# Patient Record
Sex: Male | Born: 1950 | Race: White | Hispanic: No | Marital: Married | Smoking: Former smoker
Health system: Southern US, Community
[De-identification: ages and names within clinical notes are randomized; demographics above are authoritative.]

## PROBLEM LIST (undated history)

## (undated) DIAGNOSIS — E785 Hyperlipidemia, unspecified: Secondary | ICD-10-CM

## (undated) DIAGNOSIS — IMO0002 Reserved for concepts with insufficient information to code with codable children: Secondary | ICD-10-CM

## (undated) DIAGNOSIS — G4710 Hypersomnia, unspecified: Secondary | ICD-10-CM

## (undated) DIAGNOSIS — E669 Obesity, unspecified: Secondary | ICD-10-CM

## (undated) DIAGNOSIS — I10 Essential (primary) hypertension: Secondary | ICD-10-CM

## (undated) DIAGNOSIS — E119 Type 2 diabetes mellitus without complications: Secondary | ICD-10-CM

## (undated) DIAGNOSIS — G629 Polyneuropathy, unspecified: Secondary | ICD-10-CM

## (undated) DIAGNOSIS — M79671 Pain in right foot: Secondary | ICD-10-CM

## (undated) DIAGNOSIS — C61 Malignant neoplasm of prostate: Secondary | ICD-10-CM

## (undated) DIAGNOSIS — G4730 Sleep apnea, unspecified: Secondary | ICD-10-CM

## (undated) DIAGNOSIS — R0683 Snoring: Secondary | ICD-10-CM

## (undated) DIAGNOSIS — R55 Syncope and collapse: Secondary | ICD-10-CM

## (undated) HISTORY — DX: Polyneuropathy, unspecified: G62.9

## (undated) HISTORY — DX: Reserved for concepts with insufficient information to code with codable children: IMO0002

## (undated) HISTORY — DX: Pain in right foot: M79.671

## (undated) HISTORY — PX: PROSTATE BIOPSY: SHX241

## (undated) HISTORY — DX: Hyperlipidemia, unspecified: E78.5

## (undated) HISTORY — DX: Essential (primary) hypertension: I10

## (undated) HISTORY — DX: Syncope and collapse: R55

## (undated) HISTORY — DX: Sleep apnea, unspecified: G47.30

## (undated) HISTORY — DX: Obesity, unspecified: E66.9

## (undated) HISTORY — DX: Snoring: R06.83

## (undated) HISTORY — DX: Type 2 diabetes mellitus without complications: E11.9

## (undated) HISTORY — DX: Hypersomnia, unspecified: G47.10

---

## 1970-01-13 HISTORY — PX: APPENDECTOMY: SHX54

## 2004-01-03 ENCOUNTER — Ambulatory Visit (HOSPITAL_COMMUNITY): Admission: RE | Admit: 2004-01-03 | Discharge: 2004-01-03 | Payer: Self-pay | Admitting: Urology

## 2004-01-03 ENCOUNTER — Encounter (INDEPENDENT_AMBULATORY_CARE_PROVIDER_SITE_OTHER): Payer: Self-pay | Admitting: *Deleted

## 2004-01-03 ENCOUNTER — Ambulatory Visit (HOSPITAL_BASED_OUTPATIENT_CLINIC_OR_DEPARTMENT_OTHER): Admission: RE | Admit: 2004-01-03 | Discharge: 2004-01-03 | Payer: Self-pay | Admitting: Urology

## 2006-06-17 ENCOUNTER — Ambulatory Visit: Payer: Self-pay | Admitting: Gastroenterology

## 2006-07-23 ENCOUNTER — Ambulatory Visit: Payer: Self-pay | Admitting: Gastroenterology

## 2006-07-23 ENCOUNTER — Encounter: Payer: Self-pay | Admitting: Gastroenterology

## 2007-06-05 DIAGNOSIS — D126 Benign neoplasm of colon, unspecified: Secondary | ICD-10-CM

## 2007-06-05 DIAGNOSIS — E785 Hyperlipidemia, unspecified: Secondary | ICD-10-CM | POA: Insufficient documentation

## 2007-06-05 DIAGNOSIS — I10 Essential (primary) hypertension: Secondary | ICD-10-CM | POA: Insufficient documentation

## 2007-06-05 DIAGNOSIS — Z86010 Personal history of colonic polyps: Secondary | ICD-10-CM | POA: Insufficient documentation

## 2007-06-05 DIAGNOSIS — I1A0 Resistant hypertension: Secondary | ICD-10-CM | POA: Insufficient documentation

## 2007-06-05 DIAGNOSIS — Z87442 Personal history of urinary calculi: Secondary | ICD-10-CM | POA: Insufficient documentation

## 2007-06-05 DIAGNOSIS — E109 Type 1 diabetes mellitus without complications: Secondary | ICD-10-CM | POA: Insufficient documentation

## 2007-06-05 DIAGNOSIS — E1129 Type 2 diabetes mellitus with other diabetic kidney complication: Secondary | ICD-10-CM | POA: Insufficient documentation

## 2007-06-05 DIAGNOSIS — Z8719 Personal history of other diseases of the digestive system: Secondary | ICD-10-CM | POA: Insufficient documentation

## 2010-05-28 NOTE — Assessment & Plan Note (Signed)
[facility]                         GASTROENTEROLOGY OFFICE NOTE   Chase, [name]                         MRN:          [id number]  DATE:06/17/2006                            DOB:          02/08/1950    CHIEF COMPLAINT:  A 60 year old white male, self-referred for a  colorectal cancer screening, with a history of insulin-dependent  diabetes mellitus.   HISTORY OF PRESENT ILLNESS:  Chase [name] is a very nice 60 year old white  male who is followed by Dr. Antony Madura.  Chase [name] would like a  screening colonoscopy.  He has had diabetes for approximately 10 years,  and he is insulin-dependent.  He has no colorectal complaints and  specifically denies any abdominal pain, rectal pain, change in bowel  habits, change in stool caliber, constipation, diarrhea, melena or  hematochezia.  There is no family history of colon cancer, colon polyps  or inflammatory bowel disease.   PAST MEDICAL HISTORY:  1. Hypertension.  2. Insulin-dependent diabetes mellitus.  3. Hyperlipidemia.  4. Kidney stones.  5. Status post appendectomy.   CURRENT MEDICATIONS:  Listed on the chart, updated and reviewed.   ALLERGIES:  CODEINE, LEADING TO NAUSEA AND VOMITING.   SOCIAL HISTORY/REVIEW OF SYSTEMS:  Per the handwritten form.   PHYSICAL EXAMINATION:  GENERAL:  An overweight white male, in no acute  distress.  VITAL SIGNS:  Height 5 feet 8 inches, weight 236.2 pounds, blood  pressure 120/78, pulse 88 and regular.  HEENT:  Normocephalic and atraumatic.  Sclerae anicteric.  Oropharynx  clear.  CHEST:  Clear to auscultation bilaterally.  CARDIAC:  A regular rate and rhythm without murmurs appreciated.  ABDOMEN:  Soft, nontender, non-distended.  Normoactive bowel sounds.  No  palpable organomegaly, masses or hernia.  RECTAL:  Deferred until time of colonoscopy.  EXTREMITIES:  Without clubbing, cyanosis or edema.  NEUROLOGIC:  Alert and oriented x3.  Grossly  nonfocal.   ASSESSMENT/PLAN:  1. Average risk for colorectal cancer. Insulin-dependent diabetes      mellitus. The risks, benefits      and alternatives to colonoscopy, possible biopsy and possible      polypectomy discussed with      the patient.  He consents to proceed.  This will be scheduled      electively.  His insulin will be adjusted as per our standard      protocol and a morning procedure will be scheduled.     Venita Lick. Russella Dar, MD, [facility]  Electronically Signed    MTS/MedQ  DD: 06/17/2006  DT: 06/17/2006  Job #: [id number]

## 2010-05-31 NOTE — Op Note (Signed)
NAMECAYDEN, Chase [name]                ACCOUNT NO.:  [id number]   MEDICAL RECORD NO.:  [id number]          PATIENT TYPE:  AMB   LOCATION:  [facility]                         FACILITY:  [facility]   PHYSICIAN:  Bertram Millard. Dahlstedt, M.D.DATE OF BIRTH:  Dec 13, 1950   DATE OF PROCEDURE:  01/03/2004  DATE OF DISCHARGE:                                 OPERATIVE REPORT   PREOPERATIVE DIAGNOSES:  Phimosis with recurrent balanitis.   POSTOPERATIVE DIAGNOSES:  Phimosis with recurrent balanitis.   PRINCIPAL PROCEDURE:  Circumcision.   SURGEON:  Bertram Millard. Dahlstedt, M.D.   ANESTHESIA:  General with LMA.   COMPLICATIONS:  None.   BRIEF HISTORY:  A 60 year old diabetic male with recurrent balanitis and  with significant phimosis.  He has painful foreskin with retraction of his  foreskin or with intercourse.  He presented recently to the office for  consultation.   I recommended that the patient, with his diabetes and significant phimosis,  undergo circumcision. The risks and complications were discussed with the  patient.  He understands these and agrees to proceed.   DESCRIPTION OF PROCEDURE:  The patient was administered preoperative IV  antibiotics and taken to the operating room where he was eventually  anesthetized with LMA.  He was placed in a recumbent position. Genitalia and  perineum were prepped and draped.  Two circumcising incisions were made in  the foreskin proximally and distally. The foreskin was excised. Small  bleeders underneath were electrocoagulated. A U stitch was placed in the  frenulum to bring the proximal to the distal penile skin.  3-0 chromic was  used for this. Quadrant sutures of simple interrupted 3-0 chromic were then  placed.  In between these, simple running sutures of 3-0 chromic were  placed. Hemostasis was excellent. The usual dressing was placed.   The patient tolerated the procedure well.  He was transported to the PACU in  stable condition.   It should be  noted that the patient was administered 20 mL of 0.5% plain  Marcaine for dorsal penile block.     Step   SMD/MEDQ  D:  01/03/2004  T:  01/03/2004  Job:  [id number]   cc:   Antony Madura, M.D.  [address]  [place]  [place] [address]  Fax: [phone]

## 2011-05-01 ENCOUNTER — Encounter: Payer: Self-pay | Admitting: Gastroenterology

## 2011-06-02 ENCOUNTER — Encounter: Payer: Self-pay | Admitting: Gastroenterology

## 2011-06-23 ENCOUNTER — Ambulatory Visit (AMBULATORY_SURGERY_CENTER): Payer: Medicare Other | Admitting: *Deleted

## 2011-06-23 VITALS — Ht 68.0 in | Wt 250.6 lb

## 2011-06-23 DIAGNOSIS — Z1211 Encounter for screening for malignant neoplasm of colon: Secondary | ICD-10-CM

## 2011-06-23 MED ORDER — MOVIPREP 100 G PO SOLR: ORAL | Status: DC

## 2011-06-23 NOTE — Progress Notes (Signed)
No allergies to eggs and soybeans 

## 2011-07-07 ENCOUNTER — Ambulatory Visit (AMBULATORY_SURGERY_CENTER): Payer: Medicare Other | Admitting: Gastroenterology

## 2011-07-07 ENCOUNTER — Encounter: Payer: Self-pay | Admitting: Gastroenterology

## 2011-07-07 VITALS — BP 108/65 | HR 87 | Temp 96.9°F | Resp 14 | Ht 68.0 in | Wt 250.0 lb

## 2011-07-07 DIAGNOSIS — D126 Benign neoplasm of colon, unspecified: Secondary | ICD-10-CM

## 2011-07-07 DIAGNOSIS — Z1211 Encounter for screening for malignant neoplasm of colon: Secondary | ICD-10-CM

## 2011-07-07 DIAGNOSIS — Z86010 Personal history of colonic polyps: Secondary | ICD-10-CM

## 2011-07-07 MED ORDER — SODIUM CHLORIDE 0.9 % IV SOLN: 500.0000 mL | INTRAVENOUS | Status: DC

## 2011-07-07 NOTE — Op Note (Signed)
[facility] [address] [place], [place]  [address]  COLONOSCOPY PROCEDURE REPORT PATIENT:  Chase, [name]  MR#:  [id number] BIRTHDATE:  09-09-1950, 60 yrs. old  GENDER:  male ENDOSCOPIST:  Judie Petit T. Russella Dar, MD, [facility]  PROCEDURE DATE:  07/07/2011 PROCEDURE:  Colonoscopy with biopsy and snare polypectomy ASA CLASS:  Class II INDICATIONS:  1) surveillance and high-risk screening  2) history of pre-cancerous (adenomatous) colon polyps: 07/2006 MEDICATIONS:   MAC sedation, administered by CRNA, propofol (Diprivan) 250 mg IV DESCRIPTION OF PROCEDURE:   After the risks benefits and alternatives of the procedure were thoroughly explained, informed consent was obtained.  Digital rectal exam was performed and revealed no abnormalities.   The LB [id number]-[id number] endoscope was introduced through the anus and advanced to the cecum, which was identified by both the appendix and ileocecal valve, without limitations.  The quality of the prep was good, using MoviPrep. The instrument was then slowly withdrawn as the colon was fully examined. <<PROCEDUREIMAGES>> FINDINGS:  A sessile polyp was found in the ascending colon. It was 4 mm in size. Polyp was snared without cautery. Retrieval was unsuccessful.   Two polyps were found in the mid transverse colon. They were 5 - 6 mm in size. Polyps were snared without cautery. Retrieval was successful. Two polyps were found in the distal transverse colon. They were 3 mm in size. The polyps were removed using cold biopsy forceps.  Otherwise normal colonoscopy without other polyps, masses, vascular ectasias, or inflammatory changes. Retroflexed views in the rectum revealed internal hemorrhoids, small. The time to cecum =  2 minutes. The scope was then withdrawn (time = 14.25 min) from the patient and the procedure completed.  COMPLICATIONS:  None  ENDOSCOPIC IMPRESSION: 1) 4 mm sessile polyp in the ascending colon 2) 5 - 6 mm Two polyps in  the mid transverse colon 3) 3 mm Two polyps in the distal transverse colon 4) Internal hemorrhoids  RECOMMENDATIONS: 1) Await pathology results 2) Repeat Colonoscopy in 5 years.  Venita Lick. Russella Dar, MD, [facility]  CC:  Burton Apley, MD  n. Rosalie DoctorVenita Lick. Ilyaas Musto at 07/07/2011 10:37 AM  [name], [phone]

## 2011-07-07 NOTE — Progress Notes (Signed)
Patient did not experience any of the following events: a burn prior to discharge; a fall within the facility; wrong site/side/patient/procedure/implant event; or a hospital transfer or hospital admission upon discharge from the facility. (G8907) Patient did not have preoperative order for IV antibiotic SSI prophylaxis. (G8918)  

## 2011-07-07 NOTE — Patient Instructions (Addendum)

## 2011-07-08 ENCOUNTER — Telehealth: Payer: Self-pay | Admitting: *Deleted

## 2011-07-08 NOTE — Telephone Encounter (Signed)
  Follow up Call-  Call back number 07/07/2011  Post procedure Call Back phone  # [phone]  Permission to leave phone message Yes     No answer and no answering machine picked up to leave a message

## 2011-07-10 ENCOUNTER — Encounter: Payer: Self-pay | Admitting: Gastroenterology

## 2012-01-28 ENCOUNTER — Ambulatory Visit
Admission: RE | Admit: 2012-01-28 | Discharge: 2012-01-28 | Disposition: A | Discharge status: Home / Self Care | Payer: Medicare Other | Source: Ambulatory Visit | Attending: Internal Medicine | Admitting: Internal Medicine

## 2012-01-28 ENCOUNTER — Other Ambulatory Visit: Payer: Self-pay | Admitting: Internal Medicine

## 2012-01-28 DIAGNOSIS — Z139 Encounter for screening, unspecified: Secondary | ICD-10-CM

## 2012-01-28 DIAGNOSIS — R42 Dizziness and giddiness: Secondary | ICD-10-CM

## 2012-01-30 ENCOUNTER — Ambulatory Visit
Admission: RE | Admit: 2012-01-30 | Discharge: 2012-01-30 | Disposition: A | Discharge status: Home / Self Care | Payer: Medicare Other | Source: Ambulatory Visit | Attending: Internal Medicine | Admitting: Internal Medicine

## 2012-01-30 DIAGNOSIS — R42 Dizziness and giddiness: Secondary | ICD-10-CM

## 2012-01-30 MED ORDER — GADOBENATE DIMEGLUMINE 529 MG/ML IV SOLN
20.0000 mL | Freq: Once | INTRAVENOUS | Status: AC | PRN
  Administered 2012-01-30: 20 mL via INTRAVENOUS

## 2012-06-03 ENCOUNTER — Encounter: Payer: Self-pay | Admitting: Neurology

## 2012-06-03 ENCOUNTER — Ambulatory Visit (INDEPENDENT_AMBULATORY_CARE_PROVIDER_SITE_OTHER): Payer: Medicare Other | Admitting: Neurology

## 2012-06-03 DIAGNOSIS — G4710 Hypersomnia, unspecified: Secondary | ICD-10-CM | POA: Insufficient documentation

## 2012-06-03 DIAGNOSIS — R0609 Other forms of dyspnea: Secondary | ICD-10-CM

## 2012-06-03 DIAGNOSIS — R0989 Other specified symptoms and signs involving the circulatory and respiratory systems: Secondary | ICD-10-CM

## 2012-06-03 DIAGNOSIS — E6609 Other obesity due to excess calories: Secondary | ICD-10-CM | POA: Insufficient documentation

## 2012-06-03 DIAGNOSIS — IMO0002 Reserved for concepts with insufficient information to code with codable children: Secondary | ICD-10-CM

## 2012-06-03 DIAGNOSIS — R0683 Snoring: Secondary | ICD-10-CM

## 2012-06-03 DIAGNOSIS — F518 Other sleep disorders not due to a substance or known physiological condition: Secondary | ICD-10-CM

## 2012-06-03 MED ORDER — ARMODAFINIL 150 MG PO TABS: 150.0000 mg | ORAL_TABLET | Freq: Every day | ORAL | Status: DC

## 2012-06-03 NOTE — Patient Instructions (Addendum)
Narcolepsy Narcolepsy is a disabling neurological disorder of sleep regulation. It affects the control of sleep. It also affects the control of wakefulness. It is an interruption of the dreaming state of sleep. This state is known as REM or rapid eye movement sleep.  SYMPTOMS  The development, number, and severity of symptoms vary widely among people with the disorder. Symptoms generally begin between the ages of 3815 and 2230. The four classic symptoms of the disorder are:   Excessive daytime sleepiness.  Cataplexy. This is sudden, brief episodes of muscle weakness or paralysis. It is caused by strong emotions. Common strong emotions are laughter, anger, surprise, or anticipation.  Sleep paralysis. This is paralysis upon falling asleep or waking up.  Hallucinations. These are vivid dream-like images that occur at when you first fall asleep. Other symptoms include:   Unrelenting excessive sleepiness. This is usually the first and most obvious symptom.  Sleep attacks. Patients have strong sleep attacks throughout the day. These attacks can last for 30 seconds to more than 30 minutes. These happen no matter how much or how well the person slept the night before. These attacks end up making the person sleep at work and social events. The person can fall asleep while eating, talking, and driving. They also fall asleep at other out of place times.  Disturbed nighttime sleep.  Tossing and turning in bed.  Leg jerks.  Nightmares.  Waking up often. DIAGNOSIS  It's possible that genetics play a role in this disorder. Narcolepsy is not a rare disorder. It is often misdiagnosed. It is often diagnosed years after symptoms first appear. Early diagnosis and treatment are important. This help the physical and mental well-being of the patient. TREATMENT  There is no cure for narcolepsy. The symptoms can be controlled with behavioral and medical therapy. The excessive daytime sleepiness may be treated with  stimulant drugs. It may also be treated with the drug modafinil (Provigil). Cataplexy and other REM-sleep symptoms may be treated with antidepressant medications. Medications will reduce the symptoms. Medications will not ease symptoms entirely. Many available medications have side effects. Basic lifestyle changes may also reduce the symptoms. These changes include having regular sleep schedules and scheduled daytime naps. Other lifestyle changes include avoiding "over-stimulating" situations. Document Released: 12/20/2001 Document Revised: 03/24/2011 Document Reviewed: 12/30/2004 [facility] Patient Information 2014 [place], [place]. Sleep Apnea  Sleep apnea is a sleep disorder characterized by abnormal pauses in breathing while you sleep. When your breathing pauses, the level of oxygen in your blood decreases. This causes you to move out of deep sleep and into light sleep. As a result, your quality of sleep is poor, and the system that carries your blood throughout your body (cardiovascular system) experiences stress. If sleep apnea remains untreated, the following conditions can develop:  High blood pressure (hypertension).  Coronary artery disease.  Inability to achieve or maintain an erection (impotence).  Impairment of your thought process (cognitive dysfunction). There are three types of sleep apnea: 1. Obstructive sleep apnea Pauses in breathing during sleep because of a blocked airway. 2. Central sleep apnea Pauses in breathing during sleep because the area of the brain that controls your breathing does not send the correct signals to the muscles that control breathing. 3. Mixed sleep apnea A combination of both obstructive and central sleep apnea. RISK FACTORS The following risk factors can increase your risk of developing sleep apnea:  Being overweight.  Smoking.  Having narrow passages in your nose and throat.  Being of older age.  Being male.  Alcohol use.  Sedative and  tranquilizer use.  Ethnicity. Among individuals younger than 35 years, African Americans are at increased risk of sleep apnea. SYMPTOMS   Difficulty staying asleep.  Daytime sleepiness and fatigue.  Loss of energy.  Irritability.  Loud, heavy snoring.  Morning headaches.  Trouble concentrating.  Forgetfulness.  Decreased interest in sex. DIAGNOSIS  In order to diagnose sleep apnea, your caregiver will perform a physical examination. Your caregiver may suggest that you take a home sleep test. Your caregiver may also recommend that you spend the night in a sleep lab. In the sleep lab, several monitors record information about your heart, lungs, and brain while you sleep. Your leg and arm movements and blood oxygen level are also recorded. TREATMENT The following actions may help to resolve mild sleep apnea:  Sleeping on your side.   Using a decongestant if you have nasal congestion.   Avoiding the use of depressants, including alcohol, sedatives, and narcotics.   Losing weight and modifying your diet if you are overweight. There also are devices and treatments to help open your airway:  Oral appliances. These are custom-made mouthpieces that shift your lower jaw forward and slightly open your bite. This opens your airway.  Devices that create positive airway pressure. This positive pressure "splints" your airway open to help you breathe better during sleep. The following devices create positive airway pressure:  Continuous positive airway pressure (CPAP) device. The CPAP device creates a continuous level of air pressure with an air pump. The air is delivered to your airway through a mask while you sleep. This continuous pressure keeps your airway open.  Nasal expiratory positive airway pressure (EPAP) device. The EPAP device creates positive air pressure as you exhale. The device consists of single-use valves, which are inserted into each nostril and held in place by  adhesive. The valves create very little resistance when you inhale but create much more resistance when you exhale. That increased resistance creates the positive airway pressure. This positive pressure while you exhale keeps your airway open, making it easier to breath when you inhale again.  Bilevel positive airway pressure (BPAP) device. The BPAP device is used mainly in patients with central sleep apnea. This device is similar to the CPAP device because it also uses an air pump to deliver continuous air pressure through a mask. However, with the BPAP machine, the pressure is set at two different levels. The pressure when you exhale is lower than the pressure when you inhale.  Surgery. Typically, surgery is only done if you cannot comply with less invasive treatments or if the less invasive treatments do not improve your condition. Surgery involves removing excess tissue in your airway to create a wider passage way. Document Released: 12/20/2001 Document Revised: 07/01/2011 Document Reviewed: 05/08/2011 [facility] Patient Information 2014 [place], [place]. Driving and Equipment Restrictions Some medical problems make it dangerous to drive, ride a bike, or use machines. Some of these problems are:  A hard blow to the head (concussion).  Passing out (fainting).  Twitching and shaking (seizures).  Low blood sugar.  Taking medicine to help you relax (sedatives).  Taking pain medicines.  Wearing an eye patch.  Wearing splints. This can make it hard to use parts of your body that you need to drive safely. HOME CARE   Do not drive until your doctor says it is okay.  Do not use machines until your doctor says it is okay. You may need a form signed  by your doctor (medical release) before you can drive again. You may also need this form before you do other tasks where you need to be fully alert. MAKE SURE YOU:  Understand these instructions.  Will watch your condition.  Will get help right  away if you are not doing well or get worse. Document Released: 02/07/2004 Document Revised: 03/24/2011 Document Reviewed: 05/09/2009 [facility] Patient Information 2014 [place], [place].

## 2012-06-03 NOTE — Progress Notes (Signed)
[facility]  Provider:  Dr Deadra Diggins Referring Provider: Burton Apley, MD Primary Care Physician:  Lorenda Peck, MD  Chief Complaint  Patient presents with  . New Evaluation    mini seizures, Roberts,rm 11    HPI:  Chase [name] is a 62 y.o. male here as a referral from Dr. Su Hilt for valuation of I called mini seizures. The patient's past medical history was endorsed is positive for hypertension, diabetes mellitus him recently diagnosed  With  gouty arthritis, and the referral  Mentioned falls. [name] reports that he has sudden else where he seems to longer be mentally aware of his surroundings, he has also found himself passing old 4 seconds while driving or while working on the computer, once his wife yelled at him when he drove on the left side of the road and even tried to park on the left side. He mentioned that sometimes when he feels that his eyes and sits peacefully he will immediately drift off into a dream and he describes that he sometimes acts out part of the dreams. For example, he may dream about being in a strawberry field and his hands are moving in picking motions while he is in this state.  The patient estimates that about a year to 18 months ago the spells was begun and at times he has two a week. He stopped driving with the grandchildren and is afraid of him having an accident, and his wife is concerned as well.  Dr. Su Hilt ordered an MRI of the brain for this patient with and without contrast agent this was performed on 01/30/2012 and was found to be normal. 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 His laboratory results from 04/09/2012 show high uric acid level at 8.1 mg, his glucose level was 167 mg ,with the HBA1c at  8.3.  There is a repeat lab test from 05/18/2012 that showed an elevated white blood cell count of 10.8 K. normal TSH elevated cholesterol at 226 mg and repeat hemoglobin A1c at 7.7. He was also diagnosed with a  lower than average testosterone and has been prescribed supplements.  He reports the testosterone  made him feel strong and motivated.  The patient also reports excessive daytime sleepiness score of 16 points. He does not have regular bedtimes, but he reports that when he goes to the bathroom he often has trouble to initiate sleep. If he goes to his recliner and watches TV he seemed to fall asleep rather easily and finds himself waking up with a remote on the floor or in his hand.  His sleep habits are as follows - to the bedroom 11:30 PM to midnight, rolling asleep takes them on average an hour. It seems to be a paradox reaction to going to bed as he then has trouble initiating sleep. He often leaves the bedroom therefore goes to his recliner, where he falls asleep promptly. He wakes up between 3:30 and 4 AM spontaneously he does not have to go to the bathroom necessarily, after he wakes up he may watch TV or go to the computer screen and then goes to bed again. He estimates his overall total sleep time at night around 4 hours. His wife has noted that he snores loudly his wife has witnessed apnea. He has chronic allergic rhinitis.    .     Review of Systems: Out of a complete 14 system review, the patient complains of only the following symptoms, and all other reviewed systems  are negative. Insomnia, rhinitis, snoring, apnea, loss of awareness in little blips. Obese , no nocturia.   History   Social History  . Marital Status: Married    Spouse Name: N/A    Number of Children: N/A  . Years of Education: N/A   Occupational History  . Not on file.   Social History Main Topics  . Smoking status: Former Smoker    Quit date: 06/23/1986  . Smokeless tobacco: Never Used  . Alcohol Use: No  . Drug Use: No  . Sexually Active: Not on file   Other Topics Concern  . Not on file   Social History Narrative  . No narrative on file    Family History  Problem Relation Age of Onset  .  Colon cancer Neg Hx   . Stomach cancer Neg Hx   . Dementia Mother   . Neuropathy Brother     Past Medical History  Diagnosis Date  . Diabetes mellitus   . Hypertension   . Hyperlipidemia   . Neuropathy     hands and feet  . Syncope and collapse     Past Surgical History  Procedure Laterality Date  . Appendectomy  1972    Current Outpatient Prescriptions  Medication Sig Dispense Refill  . allopurinol (ZYLOPRIM) 100 MG tablet Take 100 mg by mouth daily. Once daily      . amLODipine (NORVASC) 10 MG tablet Take 10 mg by mouth daily.       Marland Kitchen aspirin 81 MG tablet Take 81 mg by mouth daily.      . chlorthalidone (HYGROTON) 25 MG tablet Take 25 mg by mouth daily.       Tery Sanfilippo Calcium (STOOL SOFTENER PO) Take by mouth. Takes 1 tablet every other day      . doxazosin (CARDURA) 2 MG tablet Take 2 mg by mouth at bedtime.      . Insulin Glargine (LANTUS [name]) Inject into the skin. 6pm daily      . lisinopril (PRINIVIL,ZESTRIL) 40 MG tablet Take 40 mg by mouth daily.       Marland Kitchen RELION INSULIN SYRINGE 1ML/31G 31G X 5/16" 1 ML MISC       . testosterone cypionate (DEPOTESTOTERONE CYPIONATE) 200 MG/ML injection Inject into the muscle every 30 (thirty) days.       No current facility-administered medications for this visit.    Allergies as of 06/03/2012  . (No Known Allergies)    Vitals: BP 124/72  Pulse 100  Temp(Src) 97.6 F (36.4 C) (Oral)  Ht 5\' 9"  (1.753 m)  Wt 264 lb (119.75 kg)  BMI 38.97 kg/m2 Last Weight:  Wt Readings from Last 1 Encounters:  06/03/12 264 lb (119.75 kg)   Last Height:   Ht Readings from Last 1 Encounters:  06/03/12 5\' 9"  (1.753 m)   Vision Screening:  Physical exam:  General: The patient is awake, alert and appears not in acute distress. The patient is well groomed. Head: Normocephalic, atraumatic. Neck is supple. Mallampati 2 with elongated uvula,  septal deviation noticed - he has a nasal voice.  , neck circumference: 18 inches  Cardiovascular:   Regular rate and rhythm , without  murmurs or carotid bruit, and without distended neck veins. Respiratory: Lungs are clear to auscultation. Skin:  Without evidence of edema, or rash Trunk: BMI is highly  elevated ,obese ,  patient  has normal posture.  Neurologic exam : The patient is awake and alert, oriented to place and time.  Memory subjective  described as intact. There is a normal attention span & concentration ability. Speech is fluent without dysarthria, dysphonia or aphasia nasal Speech . Mood and affect are appropriate.  Cranial nerves: Pupils are equal and briskly reactive to light. Funduscopic exam without evidence of pallor or edema. Extraocular movements  in vertical and horizontal planes intact and without nystagmus. Visual fields by finger perimetry are intact. Hearing to finger rub intact.  Facial sensation intact to fine touch. Facial motor strength is symmetric and tongue and uvula move midline.  Motor exam: Normal tone and normal muscle bulk and symmetric normal strength in all extremities.  Sensory:  Fine touch, pinprick and vibration were tested in all extremities. Proprioception is tested in the upper extremities only. This was  normal.  Coordination: Rapid alternating movements in the fingers/hands is tested and normal. Finger-to-nose maneuver tested and normal without evidence of ataxia, dysmetria or tremor.  Gait and station: Patient walks without assistive device and is able and assisted stool climb up to the exam table. Strength within normal limits. Stance is stable and normal. Tandem gait is unfragmented. Romberg testing is normal.  Deep tendon reflexes: in the  upper and lower extremities are symmetric and intact. Babinski maneuver response is  downgoing.   Assessment:  After physical and neurologic examination, review of laboratory studies, imaging, neurophysiology testing and pre-existing records, assessment will be reviewed on the problem list.  Plan:   Treatment plan and additional workup ( see Problem List).  This patient's history points to severe daytime hypersomnia, inability to fall asleep in supine position, orthopnea. History of rhinitis and nasal septum deviation. Obesity, diabetes and gouty arthritis. I suspect strongly that sleep apnea as a cause for the patient's brief periods of loss of consciousness or awareness, these are not associated with any convulsion, and he reports going into a dream status very quickly when taking a nap. He has an irresistible urge to fall asleep he is a former smoker but hasn't smoked in 20 years. His risk factors for obstructive sleep apnea are multiple. There are also witnessed snoring and apnea spells as described by his wife. The Epworth sleepiness scale was endorsed at 16 points. No evidence of cataplectic attacks. He describes that when he gets startled he can move and react normal Plan I will order a split night study for this patient, but given his complaint of insomnia when in bed, I would allow him for a sleep aid. In addition I will not perform an EEG. The MRI was normal and not indicative of TIAs .

## 2012-06-16 ENCOUNTER — Ambulatory Visit (INDEPENDENT_AMBULATORY_CARE_PROVIDER_SITE_OTHER): Payer: Medicare Other | Admitting: Neurology

## 2012-06-16 DIAGNOSIS — G4733 Obstructive sleep apnea (adult) (pediatric): Secondary | ICD-10-CM

## 2012-06-16 DIAGNOSIS — R0683 Snoring: Secondary | ICD-10-CM

## 2012-06-16 DIAGNOSIS — G4710 Hypersomnia, unspecified: Secondary | ICD-10-CM

## 2012-06-16 DIAGNOSIS — F518 Other sleep disorders not due to a substance or known physiological condition: Secondary | ICD-10-CM

## 2012-06-28 ENCOUNTER — Other Ambulatory Visit: Payer: Self-pay | Admitting: Neurology

## 2012-06-28 DIAGNOSIS — G4733 Obstructive sleep apnea (adult) (pediatric): Secondary | ICD-10-CM

## 2012-07-12 ENCOUNTER — Encounter: Payer: Self-pay | Admitting: *Deleted

## 2012-07-12 DIAGNOSIS — G4733 Obstructive sleep apnea (adult) (pediatric): Secondary | ICD-10-CM | POA: Insufficient documentation

## 2012-07-12 NOTE — Progress Notes (Signed)
See media tab for full report  

## 2012-08-24 ENCOUNTER — Encounter: Payer: Self-pay | Admitting: Neurology

## 2012-09-24 ENCOUNTER — Encounter: Payer: Self-pay | Admitting: Neurology

## 2012-09-24 ENCOUNTER — Ambulatory Visit (INDEPENDENT_AMBULATORY_CARE_PROVIDER_SITE_OTHER): Payer: Medicare Other | Admitting: Neurology

## 2012-09-24 VITALS — BP 142/78 | HR 96 | Resp 15 | Ht 68.5 in | Wt 243.0 lb

## 2012-09-24 DIAGNOSIS — E0849 Diabetes mellitus due to underlying condition with other diabetic neurological complication: Secondary | ICD-10-CM

## 2012-09-24 DIAGNOSIS — G4730 Sleep apnea, unspecified: Secondary | ICD-10-CM | POA: Insufficient documentation

## 2012-09-24 DIAGNOSIS — R413 Other amnesia: Secondary | ICD-10-CM

## 2012-09-24 DIAGNOSIS — E1349 Other specified diabetes mellitus with other diabetic neurological complication: Secondary | ICD-10-CM

## 2012-09-24 DIAGNOSIS — G4733 Obstructive sleep apnea (adult) (pediatric): Secondary | ICD-10-CM

## 2012-09-24 NOTE — Progress Notes (Signed)
[facility]  Provider:  Melvyn Novas, M D  Referring Provider: Burton Apley, MD Primary Care Physician:  Lorenda Peck, MD  Chief Complaint  Patient presents with  . New Evaluation    Roberts, sleep study,rm 10    HPI:  Chase [name] is a 62 y.o. male  Is seen here as a referral/ revisit  from Dr. Burton Apley for  [place],   Mr. Governor Rooks presented an initial consult on 06-03-12. He was referred for many seizures that an extensive history and symptom review as well as interviewing him and his wife indicated that this patient may have had microcytic attacks rather than seizures. But ever ordered a sleep study which was performed on 06-16-12. The results were very revealing. First of all the patient's Epworth sleepiness score was endorsed at 16 points this study documented an AHI of 105.3 RDI of 5.3 all events in non-REM sleep as the patient's the study was REM Leavy Cella. There was no difference between supine and nonsupine sleep. The [employer] of oxygen was 77% but he was not sustained in hypoxemia. The cardiac arrhythmias were recorded. The patient was titrated to CPAP the same night beginning at 5 cm water pressure and advancing to 10 cm. At this final setting his AHI was 2.2. It was also interesting that the patient get in touch in both legs at night quite frequently and this actually decreased on CPAP was initiated. CPAP dramatically improved sleep efficiency, sleep latency and REM rebound.  Today's review of his compliance data shows that for the first month of treatment between 07/17/2012 and 8-314 the patient used the machine 100% of the days for 100% of the time. Time with CPAP therapy each night was 6 hours and 41 minutes, his residual AHI was 6.4. He was followed by [facility]  Review of the data for the last 72 days now she'll average she was time of 7 hours 24 minutes residual AHI is 3.6 the machine is set at 10 cm water with a 3 cm was at [employer]. There is very  little air leak noted.    The patient's Epworth sleepiness score today is 4 points, his fatigue severity score is 35, his geriatric depression score is 6. points. Overall he has had improvement in his sleep quality he had sometimes some trouble initiating sleep on CPAP, nocturia has been alleviated to 1 on none at night, there is minor through snoring noted by his wife.  His bedtime and routines are as follows. The patient was to bed at about 11:30 to 12 PM and rises the morning at 7-8 AM . He does not take any naps and bedtime and he had no more  sleep attacks.  Is now able to watch a movie without falling asleep,  reading a book without falling asleep etc.  When Mr. Ander sleep has significantly improved and his level of alertness consequently as well he reports that he has some memory lapses that for him. His mother had Alzheimer's disease and he is concerned that this may also a in the future. The patient has mentioned to me that he sometimes forgets to close the refrigerator door, or ileus the gas on the stove, the appetite neutral causing her to roll down the hill. He frequently misplaces items has difficulty locating them. Has had them in inappropriate places such as screwdriver in the refrigerator etc. He also has noted that at night when he is hesitant to switch on the lites he may  get lost within is on pulse even was in his bedroom. A trouble to locate the bathroom door for example. He has run into walls and doors.   I would like for him to undergo a more detailed memory tested a collected and address this part of his concerns. I consider sleep apnea not be a contributor to these findings, as it is optimally treated on the current setting of CPAP.  The patient has also been successful with some weight loss , which  helps him control his diabetes, hypertension, and gout. 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   ROS - 14 system review: positive for memory loss, improved sleepiness,   Depression, gout pain in right foot.  Concerned about driving.      General: The patient is awake, alert and appears not in acute distress. The patient has facial hair . Head: Normocephalic, atraumatic. Neck is supple. Mallampati 3, neck circumference: 17.5 inches , no nasal deviation , no TMJ .  Cardiovascular:  Regular rate and rhythm, without  murmurs or carotid bruit, and without distended neck veins. Respiratory: Lungs are clear to auscultation. Skin:  Without evidence of edema, or rash Trunk: BMI is elevated .   Neurologic exam : The patient is awake and alert, oriented to place and time and anxious- concerned .  Memory subjective impaired - MMSE failed 2 out of 3 recall words, failed subtraction,  There is a normal attention span & concentration ability. Speech is fluent without  dysarthria, dysphonia or aphasia. Mood and affect are appropriate.  Cranial nerves: Pupils are equal and briskly reactive to light. Funduscopic exam without  evidence of pallor or edema. Extraocular movements  in vertical and horizontal planes intact and without nystagmus. Visual fields by finger perimetry are intact. Hearing to finger rub intact.   Facial sensation intact to fine touch. Facial motor strength is symmetric and tongue and uvula move midline.  Motor exam:   Normal tone and normal muscle bulk and symmetric normal strength in all extremities.  Sensory:  Fine touch, pinprick and vibration in all extremities normal.  Coordination: Rapid alternating movements in the fingers/hands is tested and normal. Finger-to-nose maneuver tested and normal without evidence of ataxia, dysmetria or tremor.  Gait and station: Patient walks without assistive device . Strength within normal limits. Stance is stable and normal. Tandem gait is  unsteady - he  Is able to walk on toes -  Right foot heel walk is a trace  weaker  Romberg testing is positive , he drift to the left side. .  Deep tendon reflexes: in the   upper and lower extremities are symmetric and intact. Babinski maneuver equivocal .   Assessment; Diagnoses #1 severe obstructive sleep apnea with an AHI of 102, successfully reduced under CPAP treatment to a residual AHI of 3.2. Patient 100% compliant. Plan is to continue the therapy. Underlying risk factors are obesity, high-grade Mallampati. Diagnoses #2 obesity dissected out of obstructive sleep apnea, diabetes mellitus, hypertension, gout. The patient is in the pulses of losing weight.   Diagnosis #3 memory lapses, affecting short-term memory but also procedural memory. Referral for a detailed neuropsychological memory test to Dr. Eula Flax. Positive family history . Sleep activity , talking, confusion - REM BD?  Labs for dementia and neuropathy ordered.  Diagnoses 4) gait ataxia beginning neuropathic changes spelled related joint pain in the right foot with subsequent higher fall risk and injury risk. Neuropathy is not painful, but there is significant loss of primary  modalities. I believe this is related to the diabetes.    Plan:  Rv with NP in 3 month.                                                        He is also will be  safely able to operate a car and machinery now.    Review of Systems: Out of a complete 14 system review, the patient complains of only the following symptoms, and all other reviewed systems are negative.   History   Social History  . Marital Status: Married    Spouse Name: Aram Beecham    Number of Children: 2  . Years of Education: 14   Occupational History  . disabled    Social History Main Topics  . Smoking status: Former Smoker    Quit date: 06/23/1986  . Smokeless tobacco: Never Used  . Alcohol Use: No  . Drug Use: No  . Sexual Activity: Not on file   Other Topics Concern  . Not on file   Social History Narrative  . No narrative on file    Family History  Problem Relation Age of  Onset  . Colon cancer Neg Hx   . Stomach cancer Neg Hx   . Dementia Mother   . Neuropathy Brother     Past Medical History  Diagnosis Date  . Diabetes mellitus   . Hypertension   . Hyperlipidemia   . Neuropathy     hands and feet  . Syncope and collapse   . Right foot pain   . Hypersomnia   . Snoring   . Obesity     morbid  . Other dysfunctions of sleep stages or arousal from sleep   . Sleep apnea with use of continuous positive airway pressure (CPAP)     diagnosed spells as microsleep atacks, AHI  :105--on 06-16-12 ,     Past Surgical History  Procedure Laterality Date  . Appendectomy  1972    Current Outpatient Prescriptions  Medication Sig Dispense Refill  . amLODipine (NORVASC) 10 MG tablet Take 10 mg by mouth daily.       . Armodafinil 150 MG tablet Take 1 tablet (150 mg total) by mouth daily.  7 tablet  0  . aspirin 81 MG tablet Take 81 mg by mouth daily.      . chlorthalidone (HYGROTON) 25 MG tablet Take 25 mg by mouth daily.       Tery Sanfilippo Calcium (STOOL SOFTENER PO) Take by mouth. Takes 1 tablet every other day      . doxazosin (CARDURA) 2 MG tablet Take 2 mg by mouth at bedtime.      . Insulin Glargine (LANTUS Lorton) Inject into the skin. 6pm daily      . lisinopril (PRINIVIL,ZESTRIL) 40 MG tablet Take 40 mg by mouth daily.       Marland Kitchen RELION INSULIN SYRINGE 1ML/31G 31G X 5/16" 1 ML MISC       . testosterone cypionate (DEPOTESTOTERONE CYPIONATE) 200 MG/ML injection Inject into the muscle every 30 (thirty) days.      Marland Kitchen NOVOLIN R RELION 100 UNIT/ML injection        No current facility-administered medications for this visit.    Allergies as of 09/24/2012  . (Not on File)    Vitals:  BP 142/78  Pulse 96  Resp 15  Ht 5' 8.5" (1.74 m)  Wt 243 lb (110.224 kg)  BMI 36.41 kg/m2 Last Weight:  Wt Readings from Last 1 Encounters:  09/24/12 243 lb (110.224 kg)   Last Height:   Ht Readings from Last 1 Encounters:  09/24/12 5' 8.5" (1.74 m)

## 2012-09-24 NOTE — Patient Instructions (Signed)
Memory loss evaluation

## 2012-09-29 LAB — ANA W/REFLEX: Anti Nuclear Antibody(ANA): NEGATIVE

## 2012-09-29 LAB — METHYLMALONIC ACID, SERUM: Methylmalonic Acid: 125 nmol/L (ref 0–378)

## 2012-09-30 ENCOUNTER — Encounter: Payer: Self-pay | Admitting: Neurology

## 2012-10-21 NOTE — Progress Notes (Signed)
Quick Note:  I called pt and gave the normal lab results. Pt has viewed in MyChart. ______

## 2012-11-18 ENCOUNTER — Other Ambulatory Visit: Payer: Self-pay

## 2013-01-04 ENCOUNTER — Ambulatory Visit: Payer: Medicare Other | Admitting: Nurse Practitioner

## 2013-09-27 ENCOUNTER — Ambulatory Visit: Payer: Medicare Other | Admitting: Gastroenterology

## 2013-11-15 ENCOUNTER — Other Ambulatory Visit: Payer: Self-pay | Admitting: Internal Medicine

## 2013-11-15 ENCOUNTER — Ambulatory Visit
Admission: RE | Admit: 2013-11-15 | Discharge: 2013-11-15 | Disposition: A | Discharge status: Home / Self Care | Payer: Medicare Other | Source: Ambulatory Visit | Attending: Internal Medicine | Admitting: Internal Medicine

## 2013-11-15 DIAGNOSIS — M79642 Pain in left hand: Principal | ICD-10-CM

## 2013-11-15 DIAGNOSIS — M79641 Pain in right hand: Secondary | ICD-10-CM

## 2014-08-01 ENCOUNTER — Encounter: Payer: Self-pay | Admitting: Gastroenterology

## 2015-10-16 ENCOUNTER — Telehealth: Payer: Self-pay

## 2015-10-16 NOTE — Telephone Encounter (Signed)
He has not been seen in over 3 years (since 09/2012). He will need a referral and then please make him an appt for a sleep consult. He should bring his cpap to the appointment.

## 2015-10-16 NOTE — Telephone Encounter (Signed)
This pt had split 06/2012 he needs new cpap his Medical device service told him that he needs a new cpap.  Does he new consult or schedule him for ov

## 2015-10-18 ENCOUNTER — Encounter: Payer: Self-pay | Admitting: Neurology

## 2015-10-18 ENCOUNTER — Institutional Professional Consult (permissible substitution): Payer: Medicare Other | Admitting: Neurology

## 2015-10-18 ENCOUNTER — Ambulatory Visit (INDEPENDENT_AMBULATORY_CARE_PROVIDER_SITE_OTHER): Payer: Medicare Other | Admitting: Neurology

## 2015-10-18 VITALS — BP 162/62 | HR 92 | Resp 20 | Ht 68.0 in | Wt 235.0 lb

## 2015-10-18 DIAGNOSIS — Z9989 Dependence on other enabling machines and devices: Secondary | ICD-10-CM | POA: Diagnosis not present

## 2015-10-18 DIAGNOSIS — G4733 Obstructive sleep apnea (adult) (pediatric): Secondary | ICD-10-CM

## 2015-10-18 DIAGNOSIS — E0849 Diabetes mellitus due to underlying condition with other diabetic neurological complication: Secondary | ICD-10-CM

## 2015-10-18 NOTE — Patient Instructions (Signed)

## 2015-10-18 NOTE — Progress Notes (Signed)
SLEEP MEDICINE CLINIC   Provider:  Larey Seat, M D  Referring Provider: Lorene Dy, MD Primary Care Physician:  Myriam Jacobson, MD  Chief Complaint  Patient presents with  . New Patient (Initial Visit)    needs cpap, refused memory test, uses [facility]    HPI:  Chase [name] is a 65 y.o. male , seen here as a referral  from Dr. Mancel Bale for a new sleep machine " CPAP is broken " He has chronic allergic rhinitis.  HPI:  Chase [name] is a 65 y.o. male here as a referral from Dr. Mancel Bale for a new CPAP.   He underwent on 06/16/2012 a split-night portal call polysomnography. His history lifted some lapses of awareness, excessive daytime sleepiness, sleep attacks, gout,  nocturia, morbid obesity, diabetes, hypertension, diabetic neuropathy and colon polyps.  He is a very poor sleep efficiency for the first half of the sleep study very fragmented sleep was noted partially majorly attributed to severe his sleep apnea with an AHI of 105.3. There was no difference between supine and nonsupine sleep. REM sleep had not been noted. The lowest oxygen saturation was 77% with only 10 minutes of desaturations. The heart rate was surprisingly staying in normal sinus rhythm. He was titrated from 5 cm water pressure to 10 under which the AHI became 2.2 later at home he increased his pressure to 11 and has felt comfortable with the setting of a sentence. Hypoxemia was still seen as CPAP was titrated. Heart rate remained in normal sinus rhythm.  Chase [name] was last evaluated by me in person on 06/03/2012.   Chase [name] reports that he has retired and that his sleep habits have changed. He also reports that he has compliantly used CPAP and a download confirms this his machine was set at 11 cm water pressure with 3 cm EPR and achieved an residual AHI of 0.9 average hours of use is 8 hours nightly the patient has 100% compliance over the last 30 days for over 4 hours of consecutive use. He also states that  he is not excessively daytime sleepy.  He has been a compliant CPAP user his machine has making noise and seems not to provide the same pressure. He is also concerned because he bought a machine to clean the CPAP which uses "high-power oxygen" and he believes that the cleaning process may have damaged the CPAP. Since he has been a very compliant user he wants to have the CPAP replaced as soon as possible. Today's blood pressure was very high and may be attributed to a nonworking machine  His sleep habits are as follows -Goes to bed at 22.00 hours and falls asleep promptly , goes once to the bedroom after midnight, usually has bathroom break is around 4 AM, afterwards he has trouble going back to sleep and often will make a coffee( decaf) and then sits down and watches TV for a few minutes -afterwards he can easily return to sleep. The marital bedroom is cool, quiet and dark. He sleeps on his side usually and uses one pillow . He can rise anywhere between 6 AM and 8 AM usually feels rested and restored.  His wife has noted that he snores loudly , has witnessed apneas when not using CPAP. He has chronic allergic rhinitis.   Medical history : He is still treated for hypertension with lisinopril, hydrochlorothiazide, amlodipine. He takes Novolin insulin Cardura, and uses his CPAP. Occasionally for arthritis he will use Tylenol. He has regular  diabetic foot examinations.  Social history: married, retired, 6  grandchildren.   Review of Systems: Out of a complete 14 system review, the patient complains of only the following symptoms, and all other reviewed systems are negative.  obese, hypertension.   Epworth score 1 , Fatigue severity score 27  , depression score 0/15   Social History   Social History  . Marital status: Married    Spouse name: Chase [name]  . Number of children: 2  . Years of education: 14   Occupational History  . disabled    Social History Main Topics  . Smoking status: Former  Smoker    Quit date: 06/23/1986  . Smokeless tobacco: Never Used  . Alcohol use No  . Drug use: No  . Sexual activity: Not on file   Other Topics Concern  . Not on file   Social History Narrative  . No narrative on file    Family History  Problem Relation Age of Onset  . Colon cancer Neg Hx   . Stomach cancer Neg Hx   . Dementia Mother   . Neuropathy Brother     Past Medical History:  Diagnosis Date  . Diabetes mellitus ([place])   . Hyperlipidemia   . Hypersomnia   . Hypertension   . Neuropathy ([facility])    hands and feet  . Obesity    morbid  . Other dysfunctions of sleep stages or arousal from sleep   . Right foot pain   . Sleep apnea with use of continuous positive airway pressure (CPAP)    diagnosed spells as microsleep atacks, AHI  :105--on 06-16-12 ,   . Snoring   . Syncope and collapse     Past Surgical History:  Procedure Laterality Date  . APPENDECTOMY  1972    Current Outpatient Prescriptions  Medication Sig Dispense Refill  . amLODipine (NORVASC) 10 MG tablet Take 10 mg by mouth daily.     . Armodafinil 150 MG tablet Take 1 tablet (150 mg total) by mouth daily. 7 tablet 0  . aspirin 81 MG tablet Take 81 mg by mouth daily.    Mariane Baumgarten Calcium (STOOL SOFTENER PO) Take by mouth. Takes 1 tablet every other day    . doxazosin (CARDURA) 2 MG tablet Take 4 mg by mouth at bedtime.     . hydrochlorothiazide (MICROZIDE) 12.5 MG capsule Take 12.5 mg by mouth daily.    . Insulin Glargine (LANTUS [place]) Inject into the skin. 6pm daily    . lisinopril (PRINIVIL,ZESTRIL) 40 MG tablet Take 40 mg by mouth daily.     Marland Kitchen NOVOLIN R RELION 100 UNIT/ML injection     . RELION INSULIN SYRINGE 1ML/31G 31G X 5/16" 1 ML MISC      No current facility-administered medications for this visit.     Allergies as of 10/18/2015 - Review Complete 10/18/2015  Allergen Reaction Noted  . Codeine Nausea And Vomiting 10/18/2015    Vitals: BP (!) 162/62   Pulse 92   Resp 20   Ht 5\' 8"   (1.727 m)   Wt 235 lb (106.6 kg)   BMI 35.73 kg/m  Last Weight:  Wt Readings from Last 1 Encounters:  10/18/15 235 lb (106.6 kg)   [id number] mass index is 35.73 kg/m.     Last Height:   Ht Readings from Last 1 Encounters:  10/18/15 5\' 8"  (1.727 m)    Physical exam:  General: The patient is awake, alert and appears not in acute distress.  Head: Normocephalic, atraumatic. Neck is supple. Mallampati 3,  neck circumference: 17. Nasal airflow patent , Retrognathia is not seen.  Cardiovascular:  Regular rate and rhythm , without  murmurs or carotid bruit, and without distended neck veins. Respiratory: Lungs are clear to auscultation. Skin:  Without evidence of edema, or rash Trunk:  Neurologic exam : The patient is awake and alert, oriented to place and time.    Attention span & concentration ability appears normal.  Speech is fluent,  without  dysarthria, dysphonia or aphasia.  Mood and affect are appropriate.  Cranial nerves: Pupils are equal and briskly reactive to light.  Visual fields by finger perimetry are intact. Hearing to finger rub intact.   Facial sensation intact to fine touch.  Facial motor strength is symmetric and tongue and uvula move midline. Shoulder shrug was symmetrical.   Motor exam: Normal tone, muscle bulk and symmetric strength in all extremities. Sensory:  Fine touch, pinprick and vibration were tested in all extremities. Proprioception tested in the upper extremities was normal. Coordination: Rapid alternating movements in the fingers/hands was normal. Finger-to-nose maneuver  normal without evidence of ataxia, dysmetria or tremor. Gait and station: Patient walks without assistive device and is able unassisted to climb up to the exam table. Strength within normal limits.  Stance is stable and normal. Turns with  3-4  Steps.  Deep tendon reflexes: in the  upper and lower extremities are symmetric and intact. Babinski maneuver response is downgoing.  The  patient was advised of OSA,  the diagnosed sleep disorder , the treatment options and risks for general a health and wellness arising from not treating the condition.  He is interested in having replaced his CPAP as soon as possible. Given that his last sleep study was 3 years ago and his machine in is also only 67 years old I will have to write a replacement ordered based on its performance being questionable, possibly program, loud noises, switches itself off.  I spent more than 30 minutes of face to face time with the patient. Greater than 50% of time was spent in counseling and coordination of care.   Assessment:  After physical and neurologic examination, review of laboratory studies,  Personal review of imaging studies, reports of other /same  Imaging studies ,  Results of polysomnography/ neurophysiology testing and pre-existing records as far as provided in visit., my assessment is   1) CPAP broken, loud noises , Needs replacement machine set to 11 cm water and has headgear and nasal pillows still at home.   Plan:  Treatment plan and additional workup :  [facility] - Medicare patient , needs new CPAP. Will order Sleep study CPAP titration.   Asencion Partridge Malaka Ruffner MD  10/18/2015   CC: Lorene Dy, [address] [place], [place], [place] [address]

## 2015-11-12 ENCOUNTER — Encounter: Payer: Self-pay | Admitting: Neurology

## 2016-04-04 DIAGNOSIS — G4730 Sleep apnea, unspecified: Secondary | ICD-10-CM | POA: Insufficient documentation

## 2016-04-29 ENCOUNTER — Encounter: Payer: Self-pay | Admitting: Gastroenterology

## 2016-12-25 ENCOUNTER — Ambulatory Visit
Admission: RE | Admit: 2016-12-25 | Discharge: 2016-12-25 | Disposition: A | Discharge status: Home / Self Care | Payer: Medicare Other | Source: Ambulatory Visit | Attending: Internal Medicine | Admitting: Internal Medicine

## 2016-12-25 ENCOUNTER — Other Ambulatory Visit: Payer: Self-pay | Admitting: Internal Medicine

## 2016-12-25 DIAGNOSIS — M25511 Pain in right shoulder: Secondary | ICD-10-CM

## 2018-12-27 ENCOUNTER — Other Ambulatory Visit (HOSPITAL_COMMUNITY): Payer: Self-pay | Admitting: Urology

## 2018-12-27 DIAGNOSIS — C61 Malignant neoplasm of prostate: Secondary | ICD-10-CM

## 2019-01-17 ENCOUNTER — Other Ambulatory Visit: Payer: Self-pay

## 2019-01-17 ENCOUNTER — Encounter (HOSPITAL_COMMUNITY)
Admission: RE | Admit: 2019-01-17 | Discharge: 2019-01-17 | Disposition: A | Discharge status: Home / Self Care | Payer: Medicare Other | Source: Ambulatory Visit | Attending: Urology | Admitting: Urology

## 2019-01-17 DIAGNOSIS — C61 Malignant neoplasm of prostate: Secondary | ICD-10-CM | POA: Diagnosis not present

## 2019-01-17 MED ORDER — TECHNETIUM TC 99M MEDRONATE IV KIT
22.0000 | PACK | Freq: Once | INTRAVENOUS | Status: AC
  Administered 2019-01-17: 11:00:00 22 via INTRAVENOUS

## 2019-01-27 ENCOUNTER — Encounter: Payer: Self-pay | Admitting: *Deleted

## 2019-02-10 ENCOUNTER — Encounter: Payer: Self-pay | Admitting: Radiation Oncology

## 2019-02-10 NOTE — Progress Notes (Signed)
GU Location of Tumor / Histology: prostatic adenocarcinoma  If Prostate Cancer, Gleason Score is (4 + 3) and PSA is (8.6). Prostate volume: 39.3  Chase [name] reports learning his PSA was elevated in May 2020 when he went for a routine physical. Patient explains his PCP repeated a PSA 90 days later and it was elevated at 5.3. Then, he reports this PCP repeated a PSA 30 days after that and it was 8.1. Patient reports at that point he was referred to Dr. Diona Fanti.    Biopsies of prostate (if applicable) revealed:   Past/Anticipated interventions by urology, if any: prostate biopsy, CT abd/pelvis (negative), bone scan (negative), referral for consideration of external beam radiotherapy most likely in combination with ADT  Past/Anticipated interventions by medical oncology, if any: no  Weight changes, if any: no  Bowel/Bladder complaints, if any: IPSS 9. SHIM 1. Denies dysuria, hematuria, urinary leakage or incontinence. Denies any bowel complaints.   Nausea/Vomiting, if any: no  Pain issues, if any:  denies  SAFETY ISSUES:  Prior radiation? denies  Pacemaker/ICD? denies  Possible current pregnancy? no, male patient  Is the patient on methotrexate? no  Current Complaints / other details:  69 year old male. Married. Retired. Stopped smoking in 1990 after 22 years and 2 ppd. Enjoys playing the guitar.

## 2019-02-11 ENCOUNTER — Encounter: Payer: Self-pay | Admitting: Urology

## 2019-02-11 ENCOUNTER — Ambulatory Visit
Admission: RE | Admit: 2019-02-11 | Discharge: 2019-02-11 | Disposition: A | Discharge status: Home / Self Care | Payer: Medicare Other | Source: Ambulatory Visit | Attending: Radiation Oncology | Admitting: Radiation Oncology

## 2019-02-11 ENCOUNTER — Encounter: Payer: Self-pay | Admitting: Radiation Oncology

## 2019-02-11 ENCOUNTER — Other Ambulatory Visit: Payer: Self-pay

## 2019-02-11 VITALS — Ht 68.5 in | Wt 247.0 lb

## 2019-02-11 DIAGNOSIS — C61 Malignant neoplasm of prostate: Secondary | ICD-10-CM | POA: Insufficient documentation

## 2019-02-11 HISTORY — DX: Malignant neoplasm of prostate: C61

## 2019-02-11 NOTE — Progress Notes (Signed)
Radiation Oncology         [phone] ________________________________  Initial outpatient Consultation - Conducted via Telephone due to current COVID-19 concerns for limiting patient exposure  Name: Chase [name] MRN: [id number]  Date: 02/11/2019  DOB: 01/03/1951  [id number], Chase Moll, MD  Franchot Gallo, MD   REFERRING PHYSICIAN: Franchot Gallo, MD  DIAGNOSIS: 69 y.o. gentleman with Stage T1c adenocarcinoma of the prostate with Gleason score of 4+3, and PSA of 8.6.    ICD-10-CM   1. Malignant neoplasm of prostate ([place])  C61     HISTORY OF PRESENT ILLNESS: Chase [name] is a 69 y.o. male with a diagnosis of prostate cancer. He was noted to have an elevated PSA of 5.9 by his primary care physician, Dr. Mancel Bale.This was increased from 3.7 in 05/2017 and further elevated at 8.3 when repeated in 08/2018 and 8.6 on 10/05/18.  Accordingly, he was referred for evaluation in urology by Dr. Diona Fanti on 11/10/2018,  digital rectal examination was performed at that time revealing no nodules.  The patient proceeded to transrectal ultrasound with 12 biopsies of the prostate on 12/22/2018.  The prostate volume measured 39.33 cc.  Out of 12 core biopsies, 10 were positive.  The maximum Gleason score was 4+3, and this was seen in the left base and right mid lateral. Additionally, Gleason 3+4 was seen in the right apex lateral, right base lateral, right mid, right base, left mid (small focus), and left base lateral and Gleason 3+3 was seen in the left and right apex.  For disease staging, he underwent abdomen/pelvis CT on 01/17/2019, which showed no evidence of metastatic prostate cancer.  There were small sclerotic bony lesions in the right posterior acetabulum and left iliac bone felt most likely to be bone islands and minimal heterogeneity of prostate gland without gross extra capsular tumor. A bone scan performed the same day showed was without evidence of skeletal metastases, specifically no increased  radiotracer to correspond to CT abnormality.  The patient reviewed the biopsy and imaging results with his urologist and he has kindly been referred today for discussion of potential radiation treatment options.   PREVIOUS RADIATION THERAPY: No  PAST MEDICAL HISTORY:  Past Medical History:  Diagnosis Date  . Diabetes mellitus ([place])   . Hyperlipidemia   . Hypersomnia   . Hypertension   . Neuropathy    hands and feet  . Obesity    morbid  . Other dysfunctions of sleep stages or arousal from sleep   . Prostate cancer ([place])   . Right foot pain   . Sleep apnea with use of continuous positive airway pressure (CPAP)    diagnosed spells as microsleep atacks, AHI  :105--on 06-16-12 ,   . Snoring   . Syncope and collapse       PAST SURGICAL HISTORY: Past Surgical History:  Procedure Laterality Date  . APPENDECTOMY  1972  . PROSTATE BIOPSY      FAMILY HISTORY:  Family History  Problem Relation Age of Onset  . Dementia Mother   . Neuropathy Brother   . Stomach cancer Neg Hx   . Prostate cancer Neg Hx   . Breast cancer Neg Hx   . Pancreatic cancer Neg Hx     SOCIAL HISTORY:  Social History   Socioeconomic History  . Marital status: Married    Spouse name: Caren Griffins  . Number of children: 2  . Years of education: 40  . Highest education level: Not on file  Occupational History  .  Occupation: disabled  Tobacco Use  . Smoking status: Former Smoker    Packs/day: 2.00    Years: 20.00    Pack years: 40.00    Types: Cigarettes    Quit date: 06/23/1986    Years since quitting: 32.6  . Smokeless tobacco: Never Used  Substance and Sexual Activity  . Alcohol use: No  . Drug use: No  . Sexual activity: Not Currently  Other Topics Concern  . Not on file  Social History Narrative  . Not on file   Social Determinants of Health   Financial Resource Strain:   . Difficulty of Paying Living Expenses: Not on file  Food Insecurity:   . Worried About Charity fundraiser in the  Last Year: Not on file  . Ran Out of Food in the Last Year: Not on file  Transportation Needs:   . Lack of Transportation (Medical): Not on file  . Lack of Transportation (Non-Medical): Not on file  Physical Activity:   . Days of Exercise per Week: Not on file  . Minutes of Exercise per Session: Not on file  Stress:   . Feeling of Stress : Not on file  Social Connections:   . Frequency of Communication with Friends and Family: Not on file  . Frequency of Social Gatherings with Friends and Family: Not on file  . Attends Religious Services: Not on file  . Active Member of Clubs or Organizations: Not on file  . Attends Archivist Meetings: Not on file  . Marital Status: Not on file  Intimate Partner Violence:   . Fear of Current or Ex-Partner: Not on file  . Emotionally Abused: Not on file  . Physically Abused: Not on file  . Sexually Abused: Not on file    ALLERGIES: Codeine  MEDICATIONS:  Current Outpatient Medications  Medication Sig Dispense Refill  . amLODipine (NORVASC) 10 MG tablet Take 10 mg by mouth daily.     Marland Kitchen aspirin 81 MG tablet Take 81 mg by mouth daily.    . Biotin 10000 MCG TABS Take by mouth.    . doxazosin (CARDURA) 4 MG tablet Take 4 mg by mouth daily.    . insulin aspart protamine- aspart (NOVOLOG MIX 70/30) (70-30) 100 UNIT/ML injection Inject 40 Units into the skin 2 (two) times daily with a meal.    . Insulin Syringe-Needle U-100 (INSULIN SYRINGE 1CC/31GX5/16") 31G X 5/16" 1 ML MISC     . lisinopril (ZESTRIL) 40 MG tablet      No current facility-administered medications for this encounter.    REVIEW OF SYSTEMS:  On review of systems, the patient reports that he is doing well overall. He denies any chest pain, shortness of breath, cough, fevers, chills, night sweats, unintended weight changes. He denies any bowel disturbances, and denies abdominal pain, nausea or vomiting. He denies any new musculoskeletal or joint aches or pains. His IPSS was 9,  indicating moderate urinary symptoms. His SHIM was 1, indicating he has severe erectile dysfunction. A complete review of systems is obtained and is otherwise negative.    PHYSICAL EXAM:  Wt Readings from Last 3 Encounters:  02/11/19 247 lb (112 kg)  10/18/15 235 lb (106.6 kg)  09/24/12 243 lb (110.2 kg)   Temp Readings from Last 3 Encounters:  06/03/12 97.6 F (36.4 C) (Oral)  07/07/11 (!) 96.9 F (36.1 C)   BP Readings from Last 3 Encounters:  10/18/15 (!) 162/62  09/24/12 (!) 142/78  06/16/12 139/72  Pulse Readings from Last 3 Encounters:  10/18/15 92  09/24/12 96  06/03/12 100   Pain Assessment Pain Score: 0-No pain/10  Physical exam not performed in light of telephone consult visit format.   KPS = 90  100 - Normal; no complaints; no evidence of disease. 90   - Able to carry on normal activity; minor signs or symptoms of disease. 80   - Normal activity with effort; some signs or symptoms of disease. 57   - Cares for self; unable to carry on normal activity or to do active work. 60   - Requires occasional assistance, but is able to care for most of his personal needs. 50   - Requires considerable assistance and frequent medical care. 36   - Disabled; requires special care and assistance. 7   - Severely disabled; hospital admission is indicated although death not imminent. 37   - Very sick; hospital admission necessary; active supportive treatment necessary. 10   - Moribund; fatal processes progressing rapidly. 0     - Dead  Karnofsky DA, Abelmann WH, Craver LS and Burchenal JH [phone]) The use of the nitrogen mustards in the palliative treatment of carcinoma: with particular reference to bronchogenic carcinoma Cancer 1 634-56  LABORATORY DATA:  No results found for: WBC, HGB, HCT, MCV, PLT No results found for: NA, K, CL, CO2 No results found for: ALT, AST, GGT, ALKPHOS, BILITOT   RADIOGRAPHY: NM Bone Scan Whole Body  Result Date: 01/17/2019 CLINICAL DATA:   Prostate cancer, rising PSA (8.6) EXAM: NUCLEAR MEDICINE WHOLE BODY BONE SCAN TECHNIQUE: Whole body anterior and posterior images were obtained approximately 3 hours after intravenous injection of radiopharmaceutical. RADIOPHARMACEUTICALS:  22.0 mCi Technetium-27m MDP IV COMPARISON:  CT abdomen/pelvis dated 01/17/2019 FINDINGS: No abnormal accumulation of radiotracer within the axillary or appendicular skeleton to suggest skeletal metastases. Specifically, no increased radiotracer in the bilateral pelvis to correspond to the CT abnormality. Degenerative changes in the mid/lower thoracic spine. Excretory radiotracer in the bladder. IMPRESSION: No scintigraphic evidence of skeletal metastases. Specifically, no increased radiotracer in the bilateral pelvis to correspond to the CT abnormality. Electronically Signed   By: Julian Hy M.D.   On: 01/17/2019 16:31      IMPRESSION/PLAN: This visit was conducted via Telephone to spare the patient unnecessary potential exposure in the healthcare setting during the current COVID-19 pandemic. 1. 69 y.o. gentleman with Stage T1c adenocarcinoma of the prostate with Gleason Score of 4+3, and PSA of 8.6. We discussed the patient's workup and outlined the nature of prostate cancer in this setting. The patient's T stage, Gleason's score, and PSA put him into the unfavorable intermediate risk group. Accordingly, he is eligible for a variety of potential treatment options including prostatectomy or ST-ADT in combination with either brachytherapy or 5.5 weeks of external radiation. We discussed the available radiation techniques, and focused on the details and logistics and delivery. We discussed and outlined the risks, benefits, short and long-term effects associated with radiotherapy and compared and contrasted these with prostatectomy. We discussed the role of SpaceOAR in reducing the rectal toxicity associated with radiotherapy. We also detailed the role of ADT in the  treatment of higher risk prostate cancers and outlined the associated side effects that could be expected with this therapy.  We discussed the rationale for intentionally delaying the start of radiotherapy for 2 months following the initiation of ADT to allow for the radiosensitizing effects of this medication.  He was encouraged to ask questions that were answered  to his stated satisfaction.  At the end of the conversation, the patient is interested in moving forward with 5.5 weeks of external beam therapy in combination with ST-ADT. He has not received his first Lupron injection. We will share our discussion with Dr. Diona Fanti and make arrangements for a follow up visit at [facility], first available, to start ADT now.  We will also help to coordinate for fiducial markers and SpaceOAR gel placement in April 2021,  prior to simulation, to reduce rectal toxicity from radiotherapy. He will be scheduled for CT simulation/treatment planning shortly thereafter, in anticipation of beginning IMRT in early April 2021.  He appears to have a good understanding of his disease and our treatment recommendations which are of curative intent and he is comfortable and in agreement with the stated plan. We will move forward with treatment planning accordingly.  Given current concerns for patient exposure during the COVID-19 pandemic, this encounter was conducted via telephone. The patient was notified in advance and was offered a MyChart meeting to allow for face to face communication but unfortunately reported that he did not have the appropriate resources/technology to support such a visit and instead preferred to proceed with telephone consult. The patient has given verbal consent for this type of encounter. The time spent during this encounter was 55 minutes. The attendants for this meeting include Tyler Pita MD, Ashlyn Bruning PA-C, Katie Daubenspeck- scribe, patient, Aljandro Fuelling and his wife. During the  encounter, Tyler Pita MD, Ashlyn Bruning PA-C, and scribe, Wilburn Mylar were located at [place].  Patient, Chase [name] and his wife were located at home.    Nicholos Johns, PA-C    Tyler Pita, MD  [facility] Direct Dial: [phone]  Fax: [phone] [email or web address]  Skype  [email or web address]  This document serves as a record of services personally performed by Tyler Pita, MD and Freeman Caldron, PA-C. It was created on their behalf by Wilburn Mylar, a trained medical scribe. The creation of this record is based on the scribe's personal observations and the provider's statements to them. This document has been checked and approved by the attending provider.

## 2019-02-11 NOTE — Progress Notes (Signed)
See progress note under physician encounter. 

## 2019-02-14 ENCOUNTER — Telehealth: Payer: Self-pay | Admitting: *Deleted

## 2019-02-14 NOTE — Telephone Encounter (Signed)
Called patient to inform of ADT Inj. for 02-23-19 - arrival time- 8:15 am @ Dr. Alan Ripper Office, spoke with patient and he is aware of this appt.

## 2019-02-15 ENCOUNTER — Telehealth: Payer: Self-pay | Admitting: Medical Oncology

## 2019-02-15 NOTE — Telephone Encounter (Signed)
Spoke with patient to introduce myself as the prostate nurse navigator and discuss my role. I was unable to meet him 1/29, when he consulted with Dr. Tammi Klippel. He has chosen ST-ADT with 5 1/2 weeks radiation. He confirmed appointment  for ADT injection, 2/10 at Dr. Alan Ripper office. We discussed the next steps and is aware that Enid Derry will call him with appointment to get gold markers and SpaceOar gel placed. I gave him my contact information and asked him to call me with questions or concerns. He voiced understanding.

## 2019-02-23 ENCOUNTER — Encounter: Payer: Self-pay | Admitting: Medical Oncology

## 2019-03-11 ENCOUNTER — Other Ambulatory Visit: Payer: Self-pay | Admitting: Urology

## 2019-03-11 DIAGNOSIS — C61 Malignant neoplasm of prostate: Secondary | ICD-10-CM

## 2019-04-26 ENCOUNTER — Ambulatory Visit
Admission: RE | Admit: 2019-04-26 | Discharge: 2019-04-26 | Disposition: A | Discharge status: Home / Self Care | Payer: Medicare Other | Source: Ambulatory Visit | Attending: Internal Medicine | Admitting: Internal Medicine

## 2019-04-26 ENCOUNTER — Other Ambulatory Visit: Payer: Self-pay | Admitting: Internal Medicine

## 2019-04-26 DIAGNOSIS — M5489 Other dorsalgia: Secondary | ICD-10-CM

## 2019-05-09 ENCOUNTER — Telehealth: Payer: Self-pay | Admitting: *Deleted

## 2019-05-09 NOTE — Telephone Encounter (Signed)
CALLED PATIENT TO REMIND OF SIM APPT. FOR 05-10-19 AND HIS MRI FOR 05-12-19, LVM FOR A RETURN CALL

## 2019-05-10 ENCOUNTER — Encounter: Payer: Self-pay | Admitting: Medical Oncology

## 2019-05-10 ENCOUNTER — Ambulatory Visit
Admission: RE | Admit: 2019-05-10 | Discharge: 2019-05-10 | Disposition: A | Discharge status: Home / Self Care | Payer: Medicare Other | Source: Ambulatory Visit | Attending: Radiation Oncology | Admitting: Radiation Oncology

## 2019-05-10 ENCOUNTER — Other Ambulatory Visit: Payer: Self-pay

## 2019-05-10 DIAGNOSIS — C61 Malignant neoplasm of prostate: Secondary | ICD-10-CM | POA: Insufficient documentation

## 2019-05-12 ENCOUNTER — Ambulatory Visit (HOSPITAL_COMMUNITY)
Admission: RE | Admit: 2019-05-12 | Discharge: 2019-05-12 | Disposition: A | Discharge status: Home / Self Care | Payer: Medicare Other | Source: Ambulatory Visit | Attending: Urology | Admitting: Urology

## 2019-05-12 ENCOUNTER — Other Ambulatory Visit: Payer: Self-pay

## 2019-05-12 DIAGNOSIS — C61 Malignant neoplasm of prostate: Secondary | ICD-10-CM | POA: Insufficient documentation

## 2019-05-12 NOTE — Progress Notes (Signed)
  Radiation Oncology         (336) [phone] ________________________________  Name: Chase [name] MRN: [id number]  Date: 05/10/2019  DOB: Jun 08, 1950  SIMULATION AND TREATMENT PLANNING NOTE    ICD-10-CM   1. Malignant neoplasm of prostate ([place])  C61     DIAGNOSIS:  69 y.o. gentleman with Stage T1c adenocarcinoma of the prostate with Gleason score of 4+3, and PSA of 8.6.  NARRATIVE:  The patient was brought to the [facility].  Identity was confirmed.  All relevant records and images related to the planned course of therapy were reviewed.  The patient freely provided informed written consent to proceed with treatment after reviewing the details related to the planned course of therapy. The consent form was witnessed and verified by the simulation staff.  Then, the patient was set-up in a stable reproducible supine position for radiation therapy.  A vacuum lock pillow device was custom fabricated to position his legs in a reproducible immobilized position.  Then, I performed a urethrogram under sterile conditions to identify the prostatic apex.  CT images were obtained.  Surface markings were placed.  The CT images were loaded into the planning software.  Then the prostate target and avoidance structures including the rectum, bladder, bowel and hips were contoured.  Treatment planning then occurred.  The radiation prescription was entered and confirmed.  A total of one complex treatment devices was fabricated. I have requested : Intensity Modulated Radiotherapy (IMRT) is medically necessary for this case for the following reason:  Rectal sparing.Marland Kitchen  PLAN:  The patient will receive 70 Gy in 28 fractions.  ________________________________  Sheral Apley Tammi Klippel, M.D.

## 2019-05-13 DIAGNOSIS — C61 Malignant neoplasm of prostate: Secondary | ICD-10-CM | POA: Diagnosis not present

## 2019-05-19 ENCOUNTER — Ambulatory Visit
Admission: RE | Admit: 2019-05-19 | Discharge: 2019-05-19 | Disposition: A | Discharge status: Home / Self Care | Payer: Medicare Other | Source: Ambulatory Visit | Attending: Radiation Oncology | Admitting: Radiation Oncology

## 2019-05-19 ENCOUNTER — Encounter: Payer: Self-pay | Admitting: Medical Oncology

## 2019-05-19 ENCOUNTER — Other Ambulatory Visit: Payer: Self-pay

## 2019-05-19 DIAGNOSIS — C61 Malignant neoplasm of prostate: Secondary | ICD-10-CM | POA: Diagnosis present

## 2019-05-20 ENCOUNTER — Ambulatory Visit
Admission: RE | Admit: 2019-05-20 | Discharge: 2019-05-20 | Disposition: A | Discharge status: Home / Self Care | Payer: Medicare Other | Source: Ambulatory Visit | Attending: Radiation Oncology | Admitting: Radiation Oncology

## 2019-05-20 ENCOUNTER — Other Ambulatory Visit: Payer: Self-pay

## 2019-05-20 DIAGNOSIS — C61 Malignant neoplasm of prostate: Secondary | ICD-10-CM | POA: Diagnosis not present

## 2019-05-23 ENCOUNTER — Other Ambulatory Visit: Payer: Self-pay

## 2019-05-23 ENCOUNTER — Ambulatory Visit
Admission: RE | Admit: 2019-05-23 | Discharge: 2019-05-23 | Disposition: A | Discharge status: Home / Self Care | Payer: Medicare Other | Source: Ambulatory Visit | Attending: Radiation Oncology | Admitting: Radiation Oncology

## 2019-05-23 DIAGNOSIS — C61 Malignant neoplasm of prostate: Secondary | ICD-10-CM | POA: Diagnosis not present

## 2019-05-24 ENCOUNTER — Other Ambulatory Visit: Payer: Self-pay

## 2019-05-24 ENCOUNTER — Ambulatory Visit
Admission: RE | Admit: 2019-05-24 | Discharge: 2019-05-24 | Disposition: A | Discharge status: Home / Self Care | Payer: Medicare Other | Source: Ambulatory Visit | Attending: Radiation Oncology | Admitting: Radiation Oncology

## 2019-05-24 DIAGNOSIS — C61 Malignant neoplasm of prostate: Secondary | ICD-10-CM | POA: Diagnosis not present

## 2019-05-25 ENCOUNTER — Other Ambulatory Visit: Payer: Self-pay

## 2019-05-25 ENCOUNTER — Ambulatory Visit
Admission: RE | Admit: 2019-05-25 | Discharge: 2019-05-25 | Disposition: A | Discharge status: Home / Self Care | Payer: Medicare Other | Source: Ambulatory Visit | Attending: Radiation Oncology | Admitting: Radiation Oncology

## 2019-05-25 DIAGNOSIS — C61 Malignant neoplasm of prostate: Secondary | ICD-10-CM | POA: Diagnosis not present

## 2019-05-26 ENCOUNTER — Ambulatory Visit
Admission: RE | Admit: 2019-05-26 | Discharge: 2019-05-26 | Disposition: A | Discharge status: Home / Self Care | Payer: Medicare Other | Source: Ambulatory Visit | Attending: Radiation Oncology | Admitting: Radiation Oncology

## 2019-05-26 ENCOUNTER — Other Ambulatory Visit: Payer: Self-pay

## 2019-05-26 DIAGNOSIS — C61 Malignant neoplasm of prostate: Secondary | ICD-10-CM | POA: Diagnosis not present

## 2019-05-27 ENCOUNTER — Other Ambulatory Visit: Payer: Self-pay

## 2019-05-27 ENCOUNTER — Ambulatory Visit
Admission: RE | Admit: 2019-05-27 | Discharge: 2019-05-27 | Disposition: A | Discharge status: Home / Self Care | Payer: Medicare Other | Source: Ambulatory Visit | Attending: Radiation Oncology | Admitting: Radiation Oncology

## 2019-05-27 ENCOUNTER — Other Ambulatory Visit: Payer: Self-pay | Admitting: Radiation Oncology

## 2019-05-27 DIAGNOSIS — C61 Malignant neoplasm of prostate: Secondary | ICD-10-CM | POA: Diagnosis not present

## 2019-05-30 ENCOUNTER — Ambulatory Visit
Admission: RE | Admit: 2019-05-30 | Discharge: 2019-05-30 | Disposition: A | Discharge status: Home / Self Care | Payer: Medicare Other | Source: Ambulatory Visit | Attending: Radiation Oncology | Admitting: Radiation Oncology

## 2019-05-30 ENCOUNTER — Other Ambulatory Visit: Payer: Self-pay

## 2019-05-30 DIAGNOSIS — C61 Malignant neoplasm of prostate: Secondary | ICD-10-CM | POA: Diagnosis not present

## 2019-05-31 ENCOUNTER — Ambulatory Visit
Admission: RE | Admit: 2019-05-31 | Discharge: 2019-05-31 | Disposition: A | Discharge status: Home / Self Care | Payer: Medicare Other | Source: Ambulatory Visit | Attending: Radiation Oncology | Admitting: Radiation Oncology

## 2019-05-31 ENCOUNTER — Other Ambulatory Visit: Payer: Self-pay

## 2019-05-31 DIAGNOSIS — C61 Malignant neoplasm of prostate: Secondary | ICD-10-CM | POA: Diagnosis not present

## 2019-06-01 ENCOUNTER — Ambulatory Visit
Admission: RE | Admit: 2019-06-01 | Discharge: 2019-06-01 | Disposition: A | Discharge status: Home / Self Care | Payer: Medicare Other | Source: Ambulatory Visit | Attending: Radiation Oncology | Admitting: Radiation Oncology

## 2019-06-01 ENCOUNTER — Other Ambulatory Visit: Payer: Self-pay

## 2019-06-01 DIAGNOSIS — C61 Malignant neoplasm of prostate: Secondary | ICD-10-CM | POA: Diagnosis not present

## 2019-06-02 ENCOUNTER — Ambulatory Visit
Admission: RE | Admit: 2019-06-02 | Discharge: 2019-06-02 | Disposition: A | Discharge status: Home / Self Care | Payer: Medicare Other | Source: Ambulatory Visit | Attending: Radiation Oncology | Admitting: Radiation Oncology

## 2019-06-02 ENCOUNTER — Other Ambulatory Visit: Payer: Self-pay

## 2019-06-02 DIAGNOSIS — C61 Malignant neoplasm of prostate: Secondary | ICD-10-CM | POA: Diagnosis not present

## 2019-06-03 ENCOUNTER — Ambulatory Visit
Admission: RE | Admit: 2019-06-03 | Discharge: 2019-06-03 | Disposition: A | Discharge status: Home / Self Care | Payer: Medicare Other | Source: Ambulatory Visit | Attending: Radiation Oncology | Admitting: Radiation Oncology

## 2019-06-03 ENCOUNTER — Other Ambulatory Visit: Payer: Self-pay

## 2019-06-03 DIAGNOSIS — C61 Malignant neoplasm of prostate: Secondary | ICD-10-CM | POA: Diagnosis not present

## 2019-06-06 ENCOUNTER — Ambulatory Visit
Admission: RE | Admit: 2019-06-06 | Discharge: 2019-06-06 | Disposition: A | Discharge status: Home / Self Care | Payer: Medicare Other | Source: Ambulatory Visit | Attending: Radiation Oncology | Admitting: Radiation Oncology

## 2019-06-06 ENCOUNTER — Other Ambulatory Visit: Payer: Self-pay

## 2019-06-06 DIAGNOSIS — C61 Malignant neoplasm of prostate: Secondary | ICD-10-CM | POA: Diagnosis not present

## 2019-06-07 ENCOUNTER — Other Ambulatory Visit: Payer: Self-pay

## 2019-06-07 ENCOUNTER — Ambulatory Visit
Admission: RE | Admit: 2019-06-07 | Discharge: 2019-06-07 | Disposition: A | Discharge status: Home / Self Care | Payer: Medicare Other | Source: Ambulatory Visit | Attending: Radiation Oncology | Admitting: Radiation Oncology

## 2019-06-07 DIAGNOSIS — C61 Malignant neoplasm of prostate: Secondary | ICD-10-CM | POA: Diagnosis not present

## 2019-06-08 ENCOUNTER — Ambulatory Visit
Admission: RE | Admit: 2019-06-08 | Discharge: 2019-06-08 | Disposition: A | Discharge status: Home / Self Care | Payer: Medicare Other | Source: Ambulatory Visit | Attending: Radiation Oncology | Admitting: Radiation Oncology

## 2019-06-08 ENCOUNTER — Other Ambulatory Visit: Payer: Self-pay

## 2019-06-08 DIAGNOSIS — C61 Malignant neoplasm of prostate: Secondary | ICD-10-CM | POA: Diagnosis not present

## 2019-06-09 ENCOUNTER — Other Ambulatory Visit: Payer: Self-pay

## 2019-06-09 ENCOUNTER — Ambulatory Visit
Admission: RE | Admit: 2019-06-09 | Discharge: 2019-06-09 | Disposition: A | Discharge status: Home / Self Care | Payer: Medicare Other | Source: Ambulatory Visit | Attending: Radiation Oncology | Admitting: Radiation Oncology

## 2019-06-09 DIAGNOSIS — C61 Malignant neoplasm of prostate: Secondary | ICD-10-CM | POA: Diagnosis not present

## 2019-06-10 ENCOUNTER — Other Ambulatory Visit: Payer: Self-pay

## 2019-06-10 ENCOUNTER — Ambulatory Visit
Admission: RE | Admit: 2019-06-10 | Discharge: 2019-06-10 | Disposition: A | Discharge status: Home / Self Care | Payer: Medicare Other | Source: Ambulatory Visit | Attending: Radiation Oncology | Admitting: Radiation Oncology

## 2019-06-10 DIAGNOSIS — C61 Malignant neoplasm of prostate: Secondary | ICD-10-CM | POA: Diagnosis not present

## 2019-06-14 ENCOUNTER — Ambulatory Visit
Admission: RE | Admit: 2019-06-14 | Discharge: 2019-06-14 | Disposition: A | Discharge status: Home / Self Care | Payer: Medicare Other | Source: Ambulatory Visit | Attending: Radiation Oncology | Admitting: Radiation Oncology

## 2019-06-14 ENCOUNTER — Other Ambulatory Visit: Payer: Self-pay

## 2019-06-14 ENCOUNTER — Telehealth: Payer: Self-pay | Admitting: Radiation Oncology

## 2019-06-14 DIAGNOSIS — C61 Malignant neoplasm of prostate: Secondary | ICD-10-CM | POA: Diagnosis not present

## 2019-06-14 NOTE — Telephone Encounter (Signed)
Received inbasket message from Romie Jumper that the patient is requesting a call from this RN. Phoned patient. Patient reports when wiping after a bowel movement he sees a scant amount of bright red blood on the tissue. Patient denies diarrhea. Reports bowels are formed but soft. Denies a history of external hemorrhoids. Explained this isn't uncommon to see at this stage of his radiation treatment. Explained this rectal irritation can be managed with preparation H, baby wipes, sitz bath, and rotating a pillow under each hip every hour if sitting for prolonged periods. Explained if this issue persisted or became worse despite intervention to please inform this RN. Patient verbalized understanding and expressed appreciation for the call.

## 2019-06-14 NOTE — Telephone Encounter (Signed)
-----   Message from Kerri Perches sent at 06/14/2019  9:31 AM EDT ----- Regarding: PHONE CALL Hi Chase [name],   This patient called and stated that he is having a few problems.  [name] has requested a return call.  [name] phone number is [phone].  Thanks,  [employer]

## 2019-06-15 ENCOUNTER — Other Ambulatory Visit: Payer: Self-pay

## 2019-06-15 ENCOUNTER — Ambulatory Visit
Admission: RE | Admit: 2019-06-15 | Discharge: 2019-06-15 | Disposition: A | Discharge status: Home / Self Care | Payer: Medicare Other | Source: Ambulatory Visit | Attending: Radiation Oncology | Admitting: Radiation Oncology

## 2019-06-15 DIAGNOSIS — C61 Malignant neoplasm of prostate: Secondary | ICD-10-CM | POA: Diagnosis not present

## 2019-06-16 ENCOUNTER — Ambulatory Visit
Admission: RE | Admit: 2019-06-16 | Discharge: 2019-06-16 | Disposition: A | Discharge status: Home / Self Care | Payer: Medicare Other | Source: Ambulatory Visit | Attending: Radiation Oncology | Admitting: Radiation Oncology

## 2019-06-16 ENCOUNTER — Other Ambulatory Visit: Payer: Self-pay

## 2019-06-16 DIAGNOSIS — C61 Malignant neoplasm of prostate: Secondary | ICD-10-CM | POA: Diagnosis not present

## 2019-06-17 ENCOUNTER — Ambulatory Visit
Admission: RE | Admit: 2019-06-17 | Discharge: 2019-06-17 | Disposition: A | Discharge status: Home / Self Care | Payer: Medicare Other | Source: Ambulatory Visit | Attending: Radiation Oncology | Admitting: Radiation Oncology

## 2019-06-17 ENCOUNTER — Other Ambulatory Visit: Payer: Self-pay

## 2019-06-17 DIAGNOSIS — C61 Malignant neoplasm of prostate: Secondary | ICD-10-CM | POA: Diagnosis not present

## 2019-06-20 ENCOUNTER — Encounter: Payer: Self-pay | Admitting: Medical Oncology

## 2019-06-20 ENCOUNTER — Other Ambulatory Visit: Payer: Self-pay

## 2019-06-20 ENCOUNTER — Ambulatory Visit
Admission: RE | Admit: 2019-06-20 | Discharge: 2019-06-20 | Disposition: A | Discharge status: Home / Self Care | Payer: Medicare Other | Source: Ambulatory Visit | Attending: Radiation Oncology | Admitting: Radiation Oncology

## 2019-06-20 DIAGNOSIS — C61 Malignant neoplasm of prostate: Secondary | ICD-10-CM | POA: Diagnosis not present

## 2019-06-21 ENCOUNTER — Other Ambulatory Visit: Payer: Self-pay

## 2019-06-21 ENCOUNTER — Ambulatory Visit
Admission: RE | Admit: 2019-06-21 | Discharge: 2019-06-21 | Disposition: A | Discharge status: Home / Self Care | Payer: Medicare Other | Source: Ambulatory Visit | Attending: Radiation Oncology | Admitting: Radiation Oncology

## 2019-06-21 DIAGNOSIS — C61 Malignant neoplasm of prostate: Secondary | ICD-10-CM | POA: Diagnosis not present

## 2019-06-22 ENCOUNTER — Other Ambulatory Visit: Payer: Self-pay

## 2019-06-22 ENCOUNTER — Ambulatory Visit
Admission: RE | Admit: 2019-06-22 | Discharge: 2019-06-22 | Disposition: A | Discharge status: Home / Self Care | Payer: Medicare Other | Source: Ambulatory Visit | Attending: Radiation Oncology | Admitting: Radiation Oncology

## 2019-06-22 DIAGNOSIS — C61 Malignant neoplasm of prostate: Secondary | ICD-10-CM | POA: Diagnosis not present

## 2019-06-23 ENCOUNTER — Other Ambulatory Visit: Payer: Self-pay

## 2019-06-23 ENCOUNTER — Ambulatory Visit
Admission: RE | Admit: 2019-06-23 | Discharge: 2019-06-23 | Disposition: A | Discharge status: Home / Self Care | Payer: Medicare Other | Source: Ambulatory Visit | Attending: Radiation Oncology | Admitting: Radiation Oncology

## 2019-06-23 DIAGNOSIS — C61 Malignant neoplasm of prostate: Secondary | ICD-10-CM | POA: Diagnosis not present

## 2019-06-24 ENCOUNTER — Other Ambulatory Visit: Payer: Self-pay

## 2019-06-24 ENCOUNTER — Ambulatory Visit
Admission: RE | Admit: 2019-06-24 | Discharge: 2019-06-24 | Disposition: A | Discharge status: Home / Self Care | Payer: Medicare Other | Source: Ambulatory Visit | Attending: Radiation Oncology | Admitting: Radiation Oncology

## 2019-06-24 DIAGNOSIS — C61 Malignant neoplasm of prostate: Secondary | ICD-10-CM | POA: Diagnosis not present

## 2019-06-27 ENCOUNTER — Other Ambulatory Visit: Payer: Self-pay

## 2019-06-27 ENCOUNTER — Ambulatory Visit
Admission: RE | Admit: 2019-06-27 | Discharge: 2019-06-27 | Disposition: A | Discharge status: Home / Self Care | Payer: Medicare Other | Source: Ambulatory Visit | Attending: Radiation Oncology | Admitting: Radiation Oncology

## 2019-06-27 DIAGNOSIS — C61 Malignant neoplasm of prostate: Secondary | ICD-10-CM | POA: Diagnosis not present

## 2019-06-28 ENCOUNTER — Encounter: Payer: Self-pay | Admitting: Medical Oncology

## 2019-06-28 ENCOUNTER — Other Ambulatory Visit: Payer: Self-pay

## 2019-06-28 ENCOUNTER — Ambulatory Visit
Admission: RE | Admit: 2019-06-28 | Discharge: 2019-06-28 | Disposition: A | Discharge status: Home / Self Care | Payer: Medicare Other | Source: Ambulatory Visit | Attending: Radiation Oncology | Admitting: Radiation Oncology

## 2019-06-28 ENCOUNTER — Encounter: Payer: Self-pay | Admitting: Radiation Oncology

## 2019-06-28 DIAGNOSIS — C61 Malignant neoplasm of prostate: Secondary | ICD-10-CM | POA: Diagnosis not present

## 2019-07-19 ENCOUNTER — Telehealth: Payer: Self-pay | Admitting: Radiation Oncology

## 2019-07-19 ENCOUNTER — Other Ambulatory Visit: Payer: Self-pay | Admitting: Urology

## 2019-07-19 MED ORDER — HYDROCORTISONE ACETATE 25 MG RE SUPP: 25.0000 mg | Freq: Two times a day (BID) | RECTAL | 1 refills | Status: DC | PRN

## 2019-07-19 NOTE — Telephone Encounter (Signed)
Received telephone message from Chase [name] that this patient request a return call. Phoned patient promptly. Patient reports pain associated with bowel movements. Noted that similar conversation with patient occurred on 06/14/19. Patient endorses using preparation H as directed on 06/14/19 but request something stronger. Patient continues to deny blood in bowel movement. Patient reports bowel movements are formed but not hard. Patient confirms his preferred pharmacy is [facility] at [address]. Patient understands this RN will relay his request to the provider and phone him back with direction.

## 2019-07-20 ENCOUNTER — Telehealth: Payer: Self-pay | Admitting: Radiation Oncology

## 2019-07-20 NOTE — Telephone Encounter (Signed)
Phoned patient to inform him Anusol has been escribed to his pharmacy to relieve pain associated with bowel movements. Patient explains he picked up the medication last night and "its a wonder drug." Patient reports relief of the pain associated with bowel movements now that he is using Anusol. Patient verbalized appreciation for the new script and assistance. Encouraged patient to phone back with future needs. Patient verbalized understanding.

## 2019-07-20 NOTE — Telephone Encounter (Signed)
-----   Message from Freeman Caldron, [place] sent at 07/19/2019  5:46 PM EDT ----- Regarding: RE: Painful poop Please let him know that I sent Rx for Anusol-HC suppositories to his pharmacy. Ailene Ards ----- Message ----- From: Heywood Footman, RN Sent: 07/19/2019  10:27 AM EDT To: Freeman Caldron, PA-C Subject: Painful poop                                   Patient complains of pain with bowel movements. He completed prostate radiation 70 Gy on 06/28/19. He and I discussed him using preparation H on 06/14/19. He says he has been using the preparation H but now wants something stronger. He denies blood in stool. He reports his bowel movements are formed but not hard. His preferred [facility]. His follow up with you is scheduled for 07/28/19.   Sam

## 2019-07-27 ENCOUNTER — Telehealth: Payer: Self-pay

## 2019-07-27 ENCOUNTER — Other Ambulatory Visit: Payer: Self-pay

## 2019-07-27 NOTE — Progress Notes (Signed)
Patient presents today for prostate cancer. Patient states nocturia 2 times per night. Patient denies dysuria. Patient states that he has a moderate urine stream. Patient states that he is not emptying his bladder completely. Patient denies hesitancy or straining. Patient states that he has a follow-up appointment with [facility] next week.

## 2019-07-27 NOTE — Telephone Encounter (Signed)
Spoke with patient in regards to 1 month follow-up telephone appointment with Freeman Caldron PA on 7/15/at 1:30pm. Patient verbalized understanding of appointment date and time. Meaningful use, AUA and prostate questions were reviewed. TM

## 2019-07-28 ENCOUNTER — Other Ambulatory Visit: Payer: Self-pay

## 2019-07-28 ENCOUNTER — Ambulatory Visit
Admission: RE | Admit: 2019-07-28 | Discharge: 2019-07-28 | Disposition: A | Discharge status: Home / Self Care | Payer: Medicare Other | Source: Ambulatory Visit | Attending: Urology | Admitting: Urology

## 2019-07-28 DIAGNOSIS — C61 Malignant neoplasm of prostate: Secondary | ICD-10-CM

## 2019-07-28 NOTE — Progress Notes (Signed)
Radiation Oncology         (336) [phone] ________________________________  Name: Chase [name] MRN: [id number]  Date: 07/28/2019  DOB: 09-24-1950  Post Treatment Note  CC: Lorene Dy, MD  Lorene Dy, MD  Diagnosis:   69 y.o. gentleman with Stage T1c adenocarcinoma of the prostate with Gleason score of 4+3, and PSA of 8.6.  Interval Since Last Radiation:  4 weeks, radiotherapy concurrent with ST-ADT; [place] ADT started on 02/23/2019 and received a 54-month Lupron injection 03/28/2019.  05/19/2019 - 06/28/2019: The prostate was treated to 70 Gy in 28 fractions of 2.5 Gy per fraction.  Narrative:  I spoke with the patient to conduct his routine scheduled 1 month follow up visit via telephone to spare the patient unnecessary potential exposure in the healthcare setting during the current COVID-19 pandemic.  The patient was notified in advance and gave permission to proceed with this visit format. He tolerated his recent radiotherapy relatively well with only minimal urinary symptoms with some intermittency, nocturia x2/night and urgency.  He specifically denied dysuria, gross hematuria, excessive daytime frequency, incomplete bladder emptying or incontinence.  He denied any bowel irritation and reported only mild fatigue.                          On review of systems, the patient states that he is doing quite well in general.  He continues with nocturia x2 per night with a moderate flow of stream but feels that he is not emptying his bladder on occasion.  He denies hesitancy or straining to void, dysuria, gross hematuria or incontinence.  He denies any abdominal pain or bowel issues.  He has noticed a gradual return of energy but continues with easy fatigability and occasional hot flashes associated with his ADT.  Overall, he is quite pleased with his progress to date.  ALLERGIES:  is allergic to codeine.  Meds: Current Outpatient Medications  Medication Sig Dispense Refill  . amLODipine  (NORVASC) 10 MG tablet Take 10 mg by mouth daily.     Marland Kitchen aspirin 81 MG tablet Take 81 mg by mouth daily.    . Biotin 10000 MCG TABS Take by mouth.    . doxazosin (CARDURA) 4 MG tablet Take 4 mg by mouth daily.    . hydrocortisone (ANUSOL-HC) 25 MG suppository Place 1 suppository (25 mg total) rectally 2 (two) times daily as needed (radiation proctitis). 12 suppository 1  . insulin aspart protamine- aspart (NOVOLOG MIX 70/30) (70-30) 100 UNIT/ML injection Inject 40 Units into the skin 2 (two) times daily with a meal.    . Insulin Syringe-Needle U-100 (INSULIN SYRINGE 1CC/31GX5/16") 31G X 5/16" 1 ML MISC     . lisinopril (ZESTRIL) 40 MG tablet      No current facility-administered medications for this encounter.    Physical Findings:  vitals were not taken for this visit.   /Unable to assess due to telephone follow-up visit format.  Lab Findings: No results found for: WBC, HGB, HCT, MCV, PLT   Radiographic Findings: No results found.  Impression/Plan: 1. 69 y.o. gentleman with Stage T1c adenocarcinoma of the prostate with Gleason score of 4+3, and PSA of 8.6. He will continue to follow up with urology for ongoing PSA determinations but does not currently have an appointment scheduled with Dr. Diona Fanti.  He does have an upcoming appointment for repeat ADT injection on 08/03/2019.  He has continued to tolerate the ADT relatively well.  He understands what to  expect with regards to PSA monitoring going forward. I will look forward to following his response to treatment via correspondence with urology, and would be happy to continue to participate in his care if clinically indicated. I talked to the patient about what to expect in the future, including his risk for erectile dysfunction and rectal bleeding. I encouraged him to call or return to the office if he has any questions regarding his previous radiation or possible radiation side effects. He was comfortable with this plan and will follow up  as needed.  Today, a comprehensive survivorship care plan and treatment summary was reviewed with the patient today detailing his prostate cancer diagnosis, treatment course, potential late/long-term effects of treatment, appropriate follow-up care with recommendations for the future, and patient education resources.  A copy of this summary, along with a letter will be sent to the patient's primary care provider via mail/fax/In Basket message after today's visit.  2. Cancer screening:  Due to Mr. Enriques history and his age, he should receive screening for skin cancers, colon cancer, and lung cancer.  The information and recommendations are listed on the patient's comprehensive care plan/treatment summary and were reviewed in detail with the patient.     3. Health maintenance and wellness promotion: Mr. Casher was encouraged to consume 5-7 servings of fruits and vegetables per day. He was provided a copy of the "Nutrition Rainbow" handout, as well as the handout "Take Control of Your Health and [facility]" from the [facility].  He was also encouraged to engage in moderate to vigorous exercise for 30 minutes per day most days of the week. Information was provided regarding the [facility] fitness program, which is designed for cancer survivors to help them become more physically fit after cancer treatments. We discussed that a healthy BMI is 18.5-24.9 and that maintaining a healthy weight reduces risk of cancer recurrences.  He was instructed to limit his alcohol consumption and continue to abstain from tobacco use.  Lastly, he was encouraged to use sunscreen and wear protective clothing when in the sun.     4. Support services/counseling: It is not uncommon for this period of the patient's cancer care trajectory to be one of many emotions and stressors.  Mr. Baldree was encouraged to take advantage of our many support services programs, support groups, and/or counseling in  coping with his new life as a cancer survivor after completing anti-cancer treatment.  He was offered support today through active listening and expressive supportive counseling.  He was given information regarding our available services and encouraged to contact me with any questions or for help enrolling in any of our support group/programs.      Nicholos Johns, PA-C

## 2019-12-27 NOTE — Progress Notes (Signed)
  Radiation Oncology         (336) [phone] ________________________________  Name: Chase [name] MRN: [id number]  Date: 06/28/2019  DOB: 16-Jun-1950  End of Treatment Note  Diagnosis:   69 y.o. gentleman with Stage T1c adenocarcinoma of the prostate with Gleason score of 4+3, and PSA of 8.6.     Indication for treatment:  Curative, Definitive Radiotherapy       Radiation treatment dates:   05/19/19-06/28/19  Site/dose:   The prostate was treated to 70 Gy in 28 fractions of 2.5 Gy  Beams/energy:   The patient was treated with IMRT using volumetric arc therapy delivering 6 MV X-rays to clockwise and counterclockwise circumferential arcs with a 90 degree collimator offset to avoid dose scalloping.  Image guidance was performed with daily cone beam CT prior to each fraction to align to gold markers in the prostate and assure proper bladder and rectal fill volumes.  Immobilization was achieved with BodyFix custom mold.  Narrative: The patient tolerated radiation treatment relatively well.   The patient experienced some minor urinary irritation and modest fatigue.    Plan: The patient has completed radiation treatment. He will return to radiation oncology clinic for routine followup in one month. I advised him to call or return sooner if he has any questions or concerns related to his recovery or treatment. ________________________________  Sheral Apley. Tammi Klippel, M.D.

## 2020-08-01 ENCOUNTER — Other Ambulatory Visit (HOSPITAL_COMMUNITY): Payer: Self-pay | Admitting: Internal Medicine

## 2020-08-01 DIAGNOSIS — R0989 Other specified symptoms and signs involving the circulatory and respiratory systems: Secondary | ICD-10-CM

## 2020-08-08 ENCOUNTER — Other Ambulatory Visit: Payer: Self-pay

## 2020-08-08 ENCOUNTER — Ambulatory Visit (HOSPITAL_COMMUNITY): Payer: Medicare Other

## 2020-08-08 ENCOUNTER — Ambulatory Visit (HOSPITAL_COMMUNITY)
Admission: RE | Admit: 2020-08-08 | Discharge: 2020-08-08 | Disposition: A | Discharge status: Home / Self Care | Payer: Medicare Other | Source: Ambulatory Visit | Attending: Internal Medicine | Admitting: Internal Medicine

## 2020-08-08 DIAGNOSIS — R0989 Other specified symptoms and signs involving the circulatory and respiratory systems: Secondary | ICD-10-CM

## 2021-05-22 DIAGNOSIS — H524 Presbyopia: Secondary | ICD-10-CM | POA: Diagnosis not present

## 2021-06-07 DIAGNOSIS — E119 Type 2 diabetes mellitus without complications: Secondary | ICD-10-CM | POA: Diagnosis not present

## 2021-06-07 DIAGNOSIS — H2513 Age-related nuclear cataract, bilateral: Secondary | ICD-10-CM | POA: Diagnosis not present

## 2021-06-07 DIAGNOSIS — H35363 Drusen (degenerative) of macula, bilateral: Secondary | ICD-10-CM | POA: Diagnosis not present

## 2021-06-12 DIAGNOSIS — C61 Malignant neoplasm of prostate: Secondary | ICD-10-CM | POA: Diagnosis not present

## 2021-06-17 DIAGNOSIS — R1032 Left lower quadrant pain: Secondary | ICD-10-CM | POA: Diagnosis not present

## 2021-06-18 ENCOUNTER — Other Ambulatory Visit: Payer: Self-pay | Admitting: Internal Medicine

## 2021-06-18 DIAGNOSIS — R1110 Vomiting, unspecified: Secondary | ICD-10-CM

## 2021-06-19 DIAGNOSIS — C61 Malignant neoplasm of prostate: Secondary | ICD-10-CM | POA: Diagnosis not present

## 2021-06-24 ENCOUNTER — Ambulatory Visit
Admission: RE | Admit: 2021-06-24 | Discharge: 2021-06-24 | Disposition: A | Discharge status: Home / Self Care | Payer: Medicare Other | Source: Ambulatory Visit | Attending: Internal Medicine | Admitting: Internal Medicine

## 2021-06-24 ENCOUNTER — Other Ambulatory Visit: Payer: Self-pay | Admitting: Urology

## 2021-06-24 DIAGNOSIS — R1110 Vomiting, unspecified: Secondary | ICD-10-CM

## 2021-06-24 DIAGNOSIS — Q446 Cystic disease of liver: Secondary | ICD-10-CM

## 2021-06-24 DIAGNOSIS — K224 Dyskinesia of esophagus: Secondary | ICD-10-CM | POA: Diagnosis not present

## 2021-06-24 DIAGNOSIS — K910 Vomiting following gastrointestinal surgery: Secondary | ICD-10-CM | POA: Diagnosis not present

## 2021-07-07 ENCOUNTER — Ambulatory Visit
Admission: RE | Admit: 2021-07-07 | Discharge: 2021-07-07 | Disposition: A | Discharge status: Home / Self Care | Payer: Medicare Other | Source: Ambulatory Visit | Attending: Urology | Admitting: Urology

## 2021-07-07 DIAGNOSIS — K760 Fatty (change of) liver, not elsewhere classified: Secondary | ICD-10-CM | POA: Diagnosis not present

## 2021-07-07 DIAGNOSIS — Q446 Cystic disease of liver: Secondary | ICD-10-CM

## 2021-07-07 DIAGNOSIS — K8020 Calculus of gallbladder without cholecystitis without obstruction: Secondary | ICD-10-CM | POA: Diagnosis not present

## 2021-07-07 MED ORDER — GADOBENATE DIMEGLUMINE 529 MG/ML IV SOLN
20.0000 mL | Freq: Once | INTRAVENOUS | Status: AC | PRN
  Administered 2021-07-07: 20 mL via INTRAVENOUS

## 2021-07-11 DIAGNOSIS — R442 Other hallucinations: Secondary | ICD-10-CM | POA: Diagnosis not present

## 2021-07-23 DIAGNOSIS — R002 Palpitations: Secondary | ICD-10-CM | POA: Diagnosis not present

## 2021-07-23 DIAGNOSIS — Z1331 Encounter for screening for depression: Secondary | ICD-10-CM | POA: Diagnosis not present

## 2021-07-23 DIAGNOSIS — H9113 Presbycusis, bilateral: Secondary | ICD-10-CM | POA: Diagnosis not present

## 2021-07-23 DIAGNOSIS — R1032 Left lower quadrant pain: Secondary | ICD-10-CM | POA: Diagnosis not present

## 2021-07-23 DIAGNOSIS — E785 Hyperlipidemia, unspecified: Secondary | ICD-10-CM | POA: Diagnosis not present

## 2021-07-23 DIAGNOSIS — Z1389 Encounter for screening for other disorder: Secondary | ICD-10-CM | POA: Diagnosis not present

## 2021-07-23 DIAGNOSIS — R42 Dizziness and giddiness: Secondary | ICD-10-CM | POA: Diagnosis not present

## 2021-07-23 DIAGNOSIS — D485 Neoplasm of uncertain behavior of skin: Secondary | ICD-10-CM | POA: Diagnosis not present

## 2021-07-23 DIAGNOSIS — E1165 Type 2 diabetes mellitus with hyperglycemia: Secondary | ICD-10-CM | POA: Diagnosis not present

## 2021-07-23 DIAGNOSIS — Z0000 Encounter for general adult medical examination without abnormal findings: Secondary | ICD-10-CM | POA: Diagnosis not present

## 2021-07-29 DIAGNOSIS — Z0000 Encounter for general adult medical examination without abnormal findings: Secondary | ICD-10-CM | POA: Diagnosis not present

## 2021-07-31 DIAGNOSIS — R5383 Other fatigue: Secondary | ICD-10-CM | POA: Diagnosis not present

## 2021-07-31 DIAGNOSIS — Z125 Encounter for screening for malignant neoplasm of prostate: Secondary | ICD-10-CM | POA: Diagnosis not present

## 2021-07-31 DIAGNOSIS — E7800 Pure hypercholesterolemia, unspecified: Secondary | ICD-10-CM | POA: Diagnosis not present

## 2021-07-31 DIAGNOSIS — E039 Hypothyroidism, unspecified: Secondary | ICD-10-CM | POA: Diagnosis not present

## 2021-08-29 ENCOUNTER — Other Ambulatory Visit: Payer: Self-pay | Admitting: Otolaryngology

## 2021-08-29 DIAGNOSIS — R442 Other hallucinations: Secondary | ICD-10-CM

## 2021-09-14 ENCOUNTER — Other Ambulatory Visit: Payer: Medicare Other

## 2021-09-24 ENCOUNTER — Ambulatory Visit
Admission: RE | Admit: 2021-09-24 | Discharge: 2021-09-24 | Disposition: A | Discharge status: Home / Self Care | Payer: Medicare Other | Source: Ambulatory Visit | Attending: Otolaryngology | Admitting: Otolaryngology

## 2021-09-24 DIAGNOSIS — R430 Anosmia: Secondary | ICD-10-CM | POA: Diagnosis not present

## 2021-09-24 DIAGNOSIS — R442 Other hallucinations: Secondary | ICD-10-CM

## 2021-09-24 MED ORDER — GADOBENATE DIMEGLUMINE 529 MG/ML IV SOLN
20.0000 mL | Freq: Once | INTRAVENOUS | Status: AC | PRN
  Administered 2021-09-24: 20 mL via INTRAVENOUS

## 2021-10-22 DIAGNOSIS — Z23 Encounter for immunization: Secondary | ICD-10-CM | POA: Diagnosis not present

## 2021-10-22 DIAGNOSIS — E1165 Type 2 diabetes mellitus with hyperglycemia: Secondary | ICD-10-CM | POA: Diagnosis not present

## 2021-10-22 DIAGNOSIS — D485 Neoplasm of uncertain behavior of skin: Secondary | ICD-10-CM | POA: Diagnosis not present

## 2021-12-18 DIAGNOSIS — C61 Malignant neoplasm of prostate: Secondary | ICD-10-CM | POA: Diagnosis not present

## 2021-12-25 DIAGNOSIS — Z8546 Personal history of malignant neoplasm of prostate: Secondary | ICD-10-CM | POA: Diagnosis not present

## 2021-12-25 DIAGNOSIS — N5201 Erectile dysfunction due to arterial insufficiency: Secondary | ICD-10-CM | POA: Diagnosis not present

## 2022-01-21 DIAGNOSIS — D485 Neoplasm of uncertain behavior of skin: Secondary | ICD-10-CM | POA: Diagnosis not present

## 2022-01-21 DIAGNOSIS — E1165 Type 2 diabetes mellitus with hyperglycemia: Secondary | ICD-10-CM | POA: Diagnosis not present

## 2022-01-23 DIAGNOSIS — E1165 Type 2 diabetes mellitus with hyperglycemia: Secondary | ICD-10-CM | POA: Diagnosis not present

## 2022-03-06 DIAGNOSIS — J209 Acute bronchitis, unspecified: Secondary | ICD-10-CM | POA: Diagnosis not present

## 2022-05-25 ENCOUNTER — Encounter: Payer: Self-pay | Admitting: Family Medicine

## 2022-05-27 ENCOUNTER — Ambulatory Visit (INDEPENDENT_AMBULATORY_CARE_PROVIDER_SITE_OTHER): Payer: Medicare Other | Admitting: Family Medicine

## 2022-05-27 ENCOUNTER — Encounter: Payer: Self-pay | Admitting: Family Medicine

## 2022-05-27 VITALS — BP 164/72 | HR 89 | Temp 98.4°F | Ht 69.0 in | Wt 258.4 lb

## 2022-05-27 DIAGNOSIS — Z8546 Personal history of malignant neoplasm of prostate: Secondary | ICD-10-CM | POA: Diagnosis not present

## 2022-05-27 DIAGNOSIS — Z794 Long term (current) use of insulin: Secondary | ICD-10-CM

## 2022-05-27 DIAGNOSIS — Z789 Other specified health status: Secondary | ICD-10-CM | POA: Diagnosis not present

## 2022-05-27 DIAGNOSIS — E782 Mixed hyperlipidemia: Secondary | ICD-10-CM

## 2022-05-27 DIAGNOSIS — M791 Myalgia, unspecified site: Secondary | ICD-10-CM | POA: Insufficient documentation

## 2022-05-27 DIAGNOSIS — E109 Type 1 diabetes mellitus without complications: Secondary | ICD-10-CM | POA: Diagnosis not present

## 2022-05-27 DIAGNOSIS — I10 Essential (primary) hypertension: Secondary | ICD-10-CM

## 2022-05-27 DIAGNOSIS — E1121 Type 2 diabetes mellitus with diabetic nephropathy: Secondary | ICD-10-CM | POA: Diagnosis not present

## 2022-05-27 DIAGNOSIS — E1129 Type 2 diabetes mellitus with other diabetic kidney complication: Secondary | ICD-10-CM | POA: Insufficient documentation

## 2022-05-27 DIAGNOSIS — Z1211 Encounter for screening for malignant neoplasm of colon: Secondary | ICD-10-CM

## 2022-05-27 DIAGNOSIS — R809 Proteinuria, unspecified: Secondary | ICD-10-CM

## 2022-05-27 LAB — BASIC METABOLIC PANEL
BUN: 17 mg/dL (ref 6–23)
CO2: 25 mEq/L (ref 19–32)
Calcium: 9.6 mg/dL (ref 8.4–10.5)
Chloride: 97 mEq/L (ref 96–112)
Creatinine, Ser: 1.14 mg/dL (ref 0.40–1.50)
GFR: 63.76 mL/min (ref >60.00)
Glucose, Bld: 313 mg/dL — ABNORMAL HIGH (ref 70–99)
Potassium: 4.1 mEq/L (ref 3.5–5.1)
Sodium: 134 mEq/L — ABNORMAL LOW (ref 135–145)

## 2022-05-27 LAB — LDL CHOLESTEROL, DIRECT: Direct LDL: 138 mg/dL

## 2022-05-27 LAB — LIPID PANEL
Cholesterol: 219 mg/dL — ABNORMAL HIGH (ref 0–200)
HDL: 34.6 mg/dL — ABNORMAL LOW (ref >39.00)
NonHDL: 183.99
Total CHOL/HDL Ratio: 6
Triglycerides: 321 mg/dL — ABNORMAL HIGH (ref 0.0–149.0)
VLDL: 64.2 mg/dL — ABNORMAL HIGH (ref 0.0–40.0)

## 2022-05-27 LAB — URINALYSIS, ROUTINE W REFLEX MICROSCOPIC
Bilirubin Urine: NEGATIVE
Hgb urine dipstick: NEGATIVE
Ketones, ur: NEGATIVE
Leukocytes,Ua: NEGATIVE
Nitrite: NEGATIVE
RBC / HPF: NONE SEEN (ref 0–?)
Specific Gravity, Urine: 1.015 (ref 1.000–1.030)
Total Protein, Urine: 30 — AB
Urine Glucose: >=1000 — AB
Urobilinogen, UA: 0.2 (ref 0.0–1.0)
WBC, UA: NONE SEEN (ref 0–?)
pH: 7 (ref 5.0–8.0)

## 2022-05-27 LAB — MICROALBUMIN / CREATININE URINE RATIO
Creatinine,U: 47.1 mg/dL
Microalb Creat Ratio: 57.7 mg/g — ABNORMAL HIGH (ref 0.0–30.0)
Microalb, Ur: 27.1 mg/dL — ABNORMAL HIGH (ref 0.0–1.9)

## 2022-05-27 LAB — HEMOGLOBIN A1C: Hgb A1c MFr Bld: 8.6 % — ABNORMAL HIGH (ref 4.6–6.5)

## 2022-05-27 LAB — PSA: PSA: 0.16 ng/mL (ref 0.10–4.00)

## 2022-05-27 MED ORDER — FENOFIBRATE 48 MG PO TABS: 48.0000 mg | ORAL_TABLET | Freq: Every day | ORAL | 3 refills | Status: DC

## 2022-05-27 NOTE — Assessment & Plan Note (Signed)
Patient has resistant hypertension. He should continue amlodipine 10 mg daily, doxazosin 4 mg daily, and lisinopril 40 mg daily. I will have him start taking his HCTZ 12.5 mg every day. We will check renal labs today. Follow-up in 1 month.

## 2022-05-27 NOTE — Assessment & Plan Note (Signed)
I will check a PSA test today.

## 2022-05-27 NOTE — Progress Notes (Signed)
[facility] [address] [place] [address] Dept: [phone] Dept Fax: [phone]  New Patient Office Visit  Subjective:    Patient ID: Chase [name], male    DOB: Jun 12, 1950, 72 y.o..   MRN: [id number]  Chief Complaint  Patient presents with   Establish Care    NP-establish care.  No concerns.   Not fasting.    History of Present Illness:  Patient is in today to establish care. Mr. Majkowski was born in [place], [place]. His family moved to [place] when he was very young. 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. He is now retired. He has been married for 51 years. He has two children (50, 48) and six grandchildren. He quit smoking in 1988. He denies alcohol or drug use.  Mr. Thorley notes a history of Type 2 diabetes, diagnosed in his 4s. However, his record indicates Type 1 diabetes. He is managed on insulin lispro (Humalog). He notes his blood sugars at home are frequently in the high 200s. His last A1c was > 9%. He was previously prescribed Lantus insulin, but found this to be too expensive.  Mr. Bushell has a history of prostate cancer, diagnosed in 2020. He underwent radiation treatment for tis. His PSA has been quite low since then.  Mr. Komisar has a history of hypertension. He is managed on amlodipine 10 mg daily, doxazosin 4 mg daily, HCTZ 12.5 mg every other day, and lisinopril 40 mg daily.  Mr. Loerch has a history of hyperlipidemia. He notes that he was on statins int he past. He had difficulty with a strange sensation in his muscles, constipation, and memory issues that he attributed to the medication. As such, he stopped taking the statin.  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   Class 2 obesity due to excess calories with body mass index (BMI) of 38.0 to 38.9 in adult 06/03/2012   History of colon polyps 06/05/2007   History of urinary stone 06/05/2007   Essential hypertension 06/05/2007   Hyperlipidemia 06/05/2007   Type 1 diabetes mellitus (HCC) 06/05/2007   Past Surgical History:  Procedure Laterality Date   APPENDECTOMY  1972   PROSTATE BIOPSY     Family History  Problem Relation Age of Onset   Dementia Mother    Heart disease Father    Neuropathy Brother    Diabetes Maternal Aunt    Heart disease Paternal Uncle    Stomach cancer Neg Hx    Prostate cancer Neg Hx    Breast cancer Neg Hx    Pancreatic cancer Neg Hx    Outpatient Medications Prior to Visit  Medication Sig Dispense Refill   amLODipine (NORVASC) 10 MG tablet Take 10 mg by mouth daily.      aspirin 81 MG tablet Take 81 mg by mouth daily.     doxazosin (CARDURA) 4 MG tablet Take 4 mg by mouth daily.     HUMALOG 100 UNIT/ML injection Inject 30-50 Units into the skin 3 (three) times daily with meals.     hydrochlorothiazide (MICROZIDE) 12.5 MG capsule Take 12.5 mg by mouth daily.     Insulin Syringe-Needle  U-100 (INSULIN SYRINGE 1CC/31GX5/16") 31G X 5/16" 1 ML MISC      lisinopril (ZESTRIL) 40 MG tablet      Multiple Vitamin (MULTIVITAMIN) tablet Take 1 tablet by mouth daily.     Biotin 40981 MCG TABS Take by mouth.     hydrocortisone (ANUSOL-HC) 25 MG suppository Place 1 suppository (25 mg total) rectally 2 (two) times daily as needed (radiation proctitis). 12 suppository 1   insulin aspart protamine- aspart (NOVOLOG MIX 70/30) (70-30) 100 UNIT/ML injection Inject 40 Units into the skin 2 (two) times daily with a meal.     No facility-administered medications prior to visit.   Allergies  Allergen Reactions   Codeine Nausea And Vomiting   Objective:    Today's Vitals   05/27/22 1001 05/27/22 1100  BP: (!) 174/76 (!) 164/72  Pulse: 89   Temp: 98.4 F (36.9 C)   TempSrc: Temporal   SpO2: 98%   Weight: 258 lb 6.4 oz (117.2 kg)   Height: 5\' 9"  (1.753 m)    Body mass index is 38.16 kg/m.   General: Well developed, well nourished. No acute distress. Feet- Skin intact. No sign of maceration between toes. Nails are normal. Dorsalis pedis and posterior   tibial artery pulses are normal. 5.07 monofilament testing normal. Psych: Alert and oriented. Normal mood and affect.  Health Maintenance Due  Topic Date Due   HEMOGLOBIN A1C  Never done   Medicare Annual Wellness (AWV)  Never done   FOOT EXAM  Never done   OPHTHALMOLOGY EXAM  Never done   Diabetic kidney evaluation - eGFR measurement  Never done   Diabetic kidney evaluation - Urine ACR  Never done   Hepatitis C Screening  Never done   Pneumonia Vaccine 68+ Years old (1 of 1 - PCV) Never done   COLONOSCOPY (Pts 45-73yrs Insurance coverage will need to be confirmed)  07/06/2016   DTaP/Tdap/Td (2 - Tdap) 03/28/2018     Assessment & Plan:   Problem List Items Addressed This Visit       Cardiovascular and Mediastinum   Essential hypertension - Primary    Patient has resistant hypertension. He should continue amlodipine 10 mg daily, doxazosin 4 mg daily, and lisinopril 40 mg daily. I will have him start taking his HCTZ 12.5 mg every day. We will check renal labs today. Follow-up in 1 month.      Relevant Medications   hydrochlorothiazide (MICROZIDE) 12.5 MG capsule   Other Relevant Orders   Basic metabolic panel     Endocrine   Type 1 diabetes mellitus (HCC)    The patient's history and the chart in are discrepancy about whether he has Type 1 or Type 2 diabetes. Based on history, it is more likely he has Type 2. I will check a C-peptide level to help discern this. I will also check other annual DM labs. He will continue his insulin lispro for now. I asked him to sign a record  release from his prior physician. I will see him back in 1 month and we will discuss other options to get his diabetes under control.      Relevant Medications   HUMALOG 100 UNIT/ML injection   Other Relevant Orders   Microalbumin / creatinine urine ratio   Basic metabolic panel   Hemoglobin A1c   Urinalysis, Routine w reflex microscopic   C-peptide     Other   Hyperlipidemia    Discussed increased risk for CV disease in patients with diabetes  and role of statins. However, with his intolerance, we may need to look at other therapeutic options. I will check his lipids today and we will go from there.       Relevant Medications   hydrochlorothiazide (MICROZIDE) 12.5 MG capsule   Other Relevant Orders   Lipid panel   Statin intolerance   History of prostate cancer    I will check a PSA test today.      Relevant Orders   PSA   Other Visit Diagnoses     Screening for colon cancer       Relevant Orders   Cologuard   Encounter for long-term (current) insulin use (HCC)           Return in about 4 weeks (around 06/24/2022) for Reassessment.   Loyola Mast, MD

## 2022-05-27 NOTE — Progress Notes (Deleted)
[facility] [address] [place] [address] Dept: [phone] Dept Fax: [phone]  Chronic Care Office Visit  Subjective:    Patient ID: Chase [name], male    DOB: [birth year], 72 y.o..   MRN: [id number]  Chief Complaint  Patient presents with   Establish Care    NP-establish care.  No concerns.   Not fasting.    History of Present Illness:  Patient is in today for reassessment of chronic medical issues.  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   Malignant neoplasm of prostate (HCC) 02/11/2019   Sleep apnea with use of continuous positive airway pressure (CPAP) 04/04/2016   Hypersomnia, persistent 06/03/2012   Class 2 obesity due to excess calories with body mass index (BMI) of 38.0 to 38.9 in adult 06/03/2012   History of colon polyps 06/05/2007   History of urinary stone 06/05/2007   Essential hypertension 06/05/2007   Hyperlipidemia 06/05/2007   Type 1 diabetes mellitus (HCC) 06/05/2007   Past Surgical History:  Procedure Laterality Date   APPENDECTOMY  1972   PROSTATE BIOPSY     Family History  Problem Relation Age of Onset   Dementia Mother    Heart disease Father    Neuropathy Brother    Diabetes Maternal Aunt    Heart disease Paternal Uncle    Stomach cancer Neg Hx    Prostate cancer Neg Hx    Breast cancer Neg Hx    Pancreatic cancer Neg Hx    Outpatient Medications Prior to Visit  Medication Sig Dispense Refill   amLODipine (NORVASC) 10 MG tablet Take 10 mg by mouth daily.      aspirin 81 MG tablet Take 81 mg by mouth daily.     doxazosin (CARDURA) 4 MG tablet Take 4 mg by mouth daily.     HUMALOG 100 UNIT/ML injection Inject 30-50 Units into the skin 3 (three) times daily with meals.     hydrochlorothiazide (MICROZIDE) 12.5 MG capsule Take 12.5 mg by mouth  daily.     Insulin Syringe-Needle U-100 (INSULIN SYRINGE 1CC/31GX5/16") 31G X 5/16" 1 ML MISC      lisinopril (ZESTRIL) 40 MG tablet      Multiple Vitamin (MULTIVITAMIN) tablet Take 1 tablet by mouth daily.     Biotin 13244 MCG TABS Take by mouth.     hydrocortisone (ANUSOL-HC) 25 MG suppository Place 1 suppository (25 mg total) rectally 2 (two) times daily as needed (radiation proctitis). 12 suppository 1   insulin aspart protamine- aspart (NOVOLOG MIX 70/30) (70-30) 100 UNIT/ML injection Inject 40 Units into the skin 2 (two) times daily with a meal.     No facility-administered medications prior to visit.   Allergies  Allergen Reactions   Codeine Nausea And Vomiting   Objective:   Today's Vitals   05/27/22 1001  BP: (!) 174/76  Pulse: 89  Temp: 98.4 F (36.9 C)  TempSrc: Temporal  SpO2: 98%  Weight: 258 lb 6.4 oz (117.2 kg)  Height: 5\' 9"  (1.753 m)   Body mass index is 38.16 kg/m.   General: Well developed, well nourished. No acute distress. HEENT: Normocephalic, non-traumatic. External ears normal. EAC and TMs normal bilaterally. PERRL, EOMI. Conjunctiva clear. Nose   clear without congestion or rhinorrhea. Mucous membranes moist. Oropharynx clear. Good dentition. Neck: Supple. No lymphadenopathy. No thyromegaly. Lungs: Clear to auscultation  bilaterally. No wheezing, rales or rhonchi. CV: RRR without murmurs or rubs. Pulses 2+ bilaterally. Abdomen: Soft, non-tender. Bowel sounds positive, normal pitch and frequency. No hepatosplenomegaly. No rebound or guarding. Back: Straight. No CVA tenderness bilaterally. Extremities: Full ROM. No joint swelling or tenderness. No edema noted. Skin: Warm and dry. No rashes. Neuro: CN II-XII intact. Normal sensation and DTR bilaterally. Psych: Alert and oriented. Normal mood and affect.  Health Maintenance Due  Topic Date Due   HEMOGLOBIN A1C  Never done   Medicare Annual Wellness (AWV)  Never done   FOOT EXAM  Never done    OPHTHALMOLOGY EXAM  Never done   Diabetic kidney evaluation - eGFR measurement  Never done   Diabetic kidney evaluation - Urine ACR  Never done   Hepatitis C Screening  Never done   Pneumonia Vaccine 35+ Years old (1 of 1 - PCV) Never done   COLONOSCOPY (Pts 45-56yrs Insurance coverage will need to be confirmed)  07/06/2016   DTaP/Tdap/Td (2 - Tdap) 03/28/2018    Lab Results {Labs (Optional):29002}    Assessment & Plan:   Problem List Items Addressed This Visit       Cardiovascular and Mediastinum   Essential hypertension - Primary   Relevant Medications   hydrochlorothiazide (MICROZIDE) 12.5 MG capsule   Other Relevant Orders   Basic metabolic panel     Endocrine   Type 1 diabetes mellitus (HCC)   Relevant Medications   HUMALOG 100 UNIT/ML injection   Other Relevant Orders   Microalbumin / creatinine urine ratio   Basic metabolic panel   Hemoglobin A1c   Urinalysis, Routine w reflex microscopic   C-peptide     Other   Hyperlipidemia   Relevant Medications   hydrochlorothiazide (MICROZIDE) 12.5 MG capsule   Other Relevant Orders   Lipid panel   Statin intolerance   History of prostate cancer   Relevant Orders   PSA   Other Visit Diagnoses     Screening for colon cancer       Relevant Orders   Cologuard       Return in about 4 weeks (around 06/24/2022) for Reassessment.   Loyola Mast, MD

## 2022-05-27 NOTE — Assessment & Plan Note (Signed)
Discussed increased risk for CV disease in patients with diabetes and role of statins. However, with his intolerance, we may need to look at other therapeutic options. I will check his lipids today and we will go from there.

## 2022-05-27 NOTE — Assessment & Plan Note (Signed)
The patient's history and the chart in are discrepancy about whether he has Type 1 or Type 2 diabetes. Based on history, it is more likely he has Type 2. I will check a C-peptide level to help discern this. I will also check other annual DM labs. He will continue his insulin lispro for now. I asked him to sign a record release from his prior physician. I will see him back in 1 month and we will discuss other options to get his diabetes under control.

## 2022-05-27 NOTE — Addendum Note (Signed)
Addended by: Loyola Mast on: 05/27/2022 04:36 PM   Modules accepted: Orders

## 2022-05-28 LAB — C-PEPTIDE: C-Peptide: 4.46 ng/mL — ABNORMAL HIGH (ref 0.80–3.85)

## 2022-05-28 MED ORDER — METFORMIN HCL 500 MG PO TABS: 500.0000 mg | ORAL_TABLET | Freq: Two times a day (BID) | ORAL | 3 refills | Status: DC

## 2022-05-28 NOTE — Addendum Note (Signed)
Addended by: Loyola Mast on: 05/28/2022 04:39 PM   Modules accepted: Orders

## 2022-06-10 ENCOUNTER — Encounter: Payer: Self-pay | Admitting: Family Medicine

## 2022-06-10 DIAGNOSIS — H5203 Hypermetropia, bilateral: Secondary | ICD-10-CM | POA: Diagnosis not present

## 2022-06-10 LAB — HM DIABETES EYE EXAM

## 2022-06-13 DIAGNOSIS — H524 Presbyopia: Secondary | ICD-10-CM | POA: Diagnosis not present

## 2022-06-25 ENCOUNTER — Ambulatory Visit (INDEPENDENT_AMBULATORY_CARE_PROVIDER_SITE_OTHER): Payer: Medicare Other | Admitting: Family Medicine

## 2022-06-25 ENCOUNTER — Encounter: Payer: Self-pay | Admitting: Family Medicine

## 2022-06-25 VITALS — BP 160/76 | HR 81 | Temp 98.2°F | Ht 69.0 in | Wt 254.8 lb

## 2022-06-25 DIAGNOSIS — Z794 Long term (current) use of insulin: Secondary | ICD-10-CM

## 2022-06-25 DIAGNOSIS — I1A0 Resistant hypertension: Secondary | ICD-10-CM

## 2022-06-25 DIAGNOSIS — R809 Proteinuria, unspecified: Secondary | ICD-10-CM

## 2022-06-25 DIAGNOSIS — E1129 Type 2 diabetes mellitus with other diabetic kidney complication: Secondary | ICD-10-CM

## 2022-06-25 DIAGNOSIS — E782 Mixed hyperlipidemia: Secondary | ICD-10-CM | POA: Diagnosis not present

## 2022-06-25 DIAGNOSIS — Z7984 Long term (current) use of oral hypoglycemic drugs: Secondary | ICD-10-CM | POA: Diagnosis not present

## 2022-06-25 MED ORDER — EMPAGLIFLOZIN 10 MG PO TABS: 10.0000 mg | ORAL_TABLET | Freq: Every day | ORAL | 3 refills | Status: DC

## 2022-06-25 MED ORDER — LISINOPRIL 40 MG PO TABS: 40.0000 mg | ORAL_TABLET | Freq: Every day | ORAL | 3 refills | Status: DC

## 2022-06-25 MED ORDER — AMLODIPINE BESYLATE 10 MG PO TABS: 10.0000 mg | ORAL_TABLET | Freq: Every day | ORAL | 3 refills | Status: DC

## 2022-06-25 MED ORDER — PIOGLITAZONE HCL 15 MG PO TABS: 15.0000 mg | ORAL_TABLET | Freq: Every day | ORAL | 3 refills | Status: DC

## 2022-06-25 MED ORDER — CHLORTHALIDONE 25 MG PO TABS: 25.0000 mg | ORAL_TABLET | Freq: Every day | ORAL | 3 refills | Status: DC

## 2022-06-25 MED ORDER — DOXAZOSIN MESYLATE 4 MG PO TABS: 4.0000 mg | ORAL_TABLET | Freq: Every day | ORAL | 3 refills | Status: DC

## 2022-06-25 NOTE — Assessment & Plan Note (Signed)
I pointed out that fenofibrate is not a statin and that risk for constipation is low (2%). I encouraged him to start taking this.

## 2022-06-25 NOTE — Assessment & Plan Note (Signed)
Patient has resistant hypertension. He should continue amlodipine 10 mg daily, doxazosin 4 mg daily, and lisinopril 40 mg daily. I will switch him to chlorthalidone 25 mg daily. He does have some proteinuria, likely due to hypertension and diabetes. As his pressures are still quite high, I will refer him tot he [facility].

## 2022-06-25 NOTE — Assessment & Plan Note (Signed)
The elevated C-peptide is consistent with Chase [name] having Type 2 diabetes. As he has not gotten to goal on insulin and not tolerated basal insulin, I recommend we stop this therapy. I will start him on pioglitazone. I will also have him take empagliflozin 10 mg daily to help his diabetes and in light of his proteinuria.

## 2022-06-25 NOTE — Assessment & Plan Note (Signed)
We will focus on trying to achieve improved glycemic and blood pressure control. Continue lisinopril 40 mg daily. I will add an SGLT2i to his diabetes regimen.

## 2022-06-25 NOTE — Progress Notes (Signed)
[facility] [address] [place] [address] Dept: [phone] Dept Fax: [phone]  Chronic Care Office Visit  Subjective:    Patient ID: Chase [name], male    DOB: May 10, 1950, 72 y.o..   MRN: [id number]  Chief Complaint  Patient presents with   Medical Management of Chronic Issues    4 week f/u HTN.    History of Present Illness:  Patient is in today for reassessment of chronic medical issues.  Mr. Talarico notes a history of diabetes, diagnosed in his 44s. His record indicated Type 1 diabetes. He has been managed on insulin lispro (Humalog). He was previously prescribed Lantus insulin, but found this to be too expensive and to cause a headache behind his left eye. His home blood sugars have been elevated. He notes this morning his fasting glucose was ~ 181.   Mr. Mcguigan has a history of hypertension. He is managed on amlodipine 10 mg daily, doxazosin 4 mg daily, HCTZ 12.5 mg daily, and lisinopril 40 mg daily.   Mr. Brean has a history of hyperlipidemia. He notes that he was on statins in the past. He had difficulty with a strange sensation in his muscles, constipation, and memory issues that he attributed to the medication. As such, he stopped taking the statin. I had prescribed fenofibrate after his last visit. He had not taken this, concerned that this might be a statin and also concerned about possible constipation.  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   Malignant neoplasm of prostate (HCC) 02/11/2019   Sleep apnea with use of continuous positive airway pressure (CPAP) 04/04/2016   Hypersomnia, persistent 06/03/2012   Class 2 obesity due to excess calories with body mass index (BMI) of 38.0 to 38.9 in adult 06/03/2012    History of colon polyps 06/05/2007   History of urinary stone 06/05/2007   Resistant hypertension 06/05/2007   Hyperlipidemia 06/05/2007   Type 2 diabetes mellitus with renal complication (HCC) 06/05/2007   Past Surgical History:  Procedure Laterality Date   APPENDECTOMY  1972   PROSTATE BIOPSY     Family History  Problem Relation Age of Onset   Dementia Mother    Heart disease Father    Neuropathy Brother    Diabetes Maternal Aunt    Heart disease Paternal Uncle    Stomach cancer Neg Hx    Prostate cancer Neg Hx    Breast cancer Neg Hx    Pancreatic cancer Neg Hx    Outpatient Medications Prior to Visit  Medication Sig Dispense Refill   aspirin 81 MG tablet Take 81 mg by mouth daily.     fenofibrate (TRICOR) 48 MG tablet Take 1 tablet (48 mg total) by mouth daily. 90 tablet 3   amLODipine (NORVASC) 10 MG tablet Take 10 mg by mouth daily.      doxazosin (CARDURA) 4 MG tablet Take 4 mg by mouth daily.     HUMALOG 100 UNIT/ML injection Inject 30-50 Units into the skin 3 (three) times daily with meals.     hydrochlorothiazide (MICROZIDE) 12.5 MG capsule Take 12.5 mg by mouth daily.     Insulin Syringe-Needle U-100 (INSULIN SYRINGE 1CC/31GX5/16") 31G X 5/16" 1 ML MISC      lisinopril (ZESTRIL) 40 MG tablet  Multiple Vitamin (MULTIVITAMIN) tablet Take 1 tablet by mouth daily. (Patient not taking: Reported on 06/25/2022)     metFORMIN (GLUCOPHAGE) 500 MG tablet Take 1 tablet (500 mg total) by mouth 2 (two) times daily with a meal. (Patient not taking: Reported on 06/25/2022) 180 tablet 3   No facility-administered medications prior to visit.   Allergies  Allergen Reactions   Codeine Nausea And Vomiting   Metformin And Related Diarrhea   Objective:   Today's Vitals   06/25/22 0946  BP: (!) 168/86  Pulse: 81  Temp: 98.2 F (36.8 C)  TempSrc: Temporal  SpO2: 97%  Weight: 254 lb 12.8 oz (115.6 kg)  Height: 5\' 9"  (1.753 m)   Body mass index is 37.63 kg/m.    General: Well developed, well nourished. No acute distress. Psych: Alert and oriented. Normal mood and affect.  Health Maintenance Due  Topic Date Due   Medicare Annual Wellness (AWV)  Never done   Hepatitis C Screening  Never done   Pneumonia Vaccine 41+ Years old (1 of 1 - PCV) Never done   Colonoscopy  07/06/2016   DTaP/Tdap/Td (2 - Tdap) 03/28/2018   Lab Results    Latest Ref Rng & Units 05/27/2022   10:54 AM  CMP  Glucose 70 - 99 mg/dL 132   BUN 6 - 23 mg/dL 17   Creatinine 4.40 - 1.50 mg/dL 1.02   Sodium 725 - 366 mEq/L 134   Potassium 3.5 - 5.1 mEq/L 4.1   Chloride 96 - 112 mEq/L 97   CO2 19 - 32 mEq/L 25   Calcium 8.4 - 10.5 mg/dL 9.6    Last lipids Lab Results  Component Value Date   CHOL 219 (H) 05/27/2022   HDL 34.60 (L) 05/27/2022   LDLDIRECT 138.0 05/27/2022   TRIG 321.0 (H) 05/27/2022   CHOLHDL 6 05/27/2022   Last hemoglobin A1c Lab Results  Component Value Date   HGBA1C 8.6 (H) 05/27/2022   Component Ref Range & Units 4 wk ago  Microalb, Ur 0.0 - 1.9 mg/dL 44.0 High   Creatinine,U mg/dL 34.7  Microalb Creat Ratio 0.0 - 30.0 mg/g 57.7 High    Component Ref Range & Units 4 wk ago  C-Peptide 0.80 - 3.85 ng/mL 4.46 High       Assessment & Plan:   Problem List Items Addressed This Visit       Cardiovascular and Mediastinum   Resistant hypertension - Primary    Patient has resistant hypertension. He should continue amlodipine 10 mg daily, doxazosin 4 mg daily, and lisinopril 40 mg daily. I will switch him to chlorthalidone 25 mg daily. He does have some proteinuria, likely due to hypertension and diabetes. As his pressures are still quite high, I will refer him tot he [facility].      Relevant Medications   amLODipine (NORVASC) 10 MG tablet   doxazosin (CARDURA) 4 MG tablet   lisinopril (ZESTRIL) 40 MG tablet   chlorthalidone (HYGROTON) 25 MG tablet   Other Relevant Orders   Ambulatory referral to [facility]     Endocrine   Type 2 diabetes mellitus with renal complication (HCC)    The elevated C-peptide is consistent with Mr. Kuehl having Type 2 diabetes. As he has not gotten to goal on insulin and not tolerated basal insulin, I recommend we stop this therapy. I will start him on pioglitazone. I will also have him take empagliflozin 10 mg daily to help his  diabetes and in light of his proteinuria.      Relevant Medications   lisinopril (ZESTRIL) 40 MG tablet   empagliflozin (JARDIANCE) 10 MG TABS tablet   pioglitazone (ACTOS) 15 MG tablet   Microalbuminuria due to type 2 diabetes mellitus (HCC)    We will focus on trying to achieve improved glycemic and blood pressure control. Continue lisinopril 40 mg daily. I will add an SGLT2i to his diabetes regimen.      Relevant Medications   lisinopril (ZESTRIL) 40 MG tablet   empagliflozin (JARDIANCE) 10 MG TABS tablet   pioglitazone (ACTOS) 15 MG tablet     Other   Hyperlipidemia    I pointed out that fenofibrate is not a statin and that risk for constipation is low (2%). I encouraged him to start taking this.       Relevant Medications   amLODipine (NORVASC) 10 MG tablet   doxazosin (CARDURA) 4 MG tablet   lisinopril (ZESTRIL) 40 MG tablet   chlorthalidone (HYGROTON) 25 MG tablet    Return in about 3 months (around 09/25/2022) for Reassessment.   Loyola Mast, MD

## 2022-07-08 ENCOUNTER — Telehealth: Payer: Self-pay | Admitting: Family Medicine

## 2022-07-08 ENCOUNTER — Encounter: Payer: Self-pay | Admitting: Family Medicine

## 2022-07-08 ENCOUNTER — Ambulatory Visit (INDEPENDENT_AMBULATORY_CARE_PROVIDER_SITE_OTHER): Payer: Medicare Other | Admitting: Family Medicine

## 2022-07-08 VITALS — BP 138/68 | HR 96 | Temp 98.4°F | Ht 69.0 in | Wt 246.0 lb

## 2022-07-08 DIAGNOSIS — Z794 Long term (current) use of insulin: Secondary | ICD-10-CM | POA: Diagnosis not present

## 2022-07-08 DIAGNOSIS — I1A0 Resistant hypertension: Secondary | ICD-10-CM

## 2022-07-08 DIAGNOSIS — E1129 Type 2 diabetes mellitus with other diabetic kidney complication: Secondary | ICD-10-CM | POA: Diagnosis not present

## 2022-07-08 MED ORDER — INSULIN LISPRO 100 UNIT/ML IJ SOLN: 40.0000 [IU] | Freq: Three times a day (TID) | INTRAMUSCULAR | 11 refills | Status: DC

## 2022-07-08 MED ORDER — INSULIN PEN NEEDLE 30G X 8 MM MISC: 1.0000 | 11 refills | Status: DC | PRN

## 2022-07-08 MED ORDER — EMPAGLIFLOZIN 25 MG PO TABS: 25.0000 mg | ORAL_TABLET | Freq: Every day | ORAL | 3 refills | Status: DC

## 2022-07-08 MED ORDER — PIOGLITAZONE HCL 30 MG PO TABS: 30.0000 mg | ORAL_TABLET | Freq: Every day | ORAL | 3 refills | Status: DC

## 2022-07-08 MED ORDER — "INSULIN SYRINGE 29G X 1/2"" 0.3 ML MISC": 11 refills | Status: AC

## 2022-07-08 NOTE — Assessment & Plan Note (Signed)
BP improved with the addition of chlorthalidone. I will continue amlodipine 10 mg daily, doxazosin 4 mg daily, lisinopril 40 mg daily and chlorthalidone 25 mg daily. I will recheck this in 2 weeks. Plan BMP at next visit.

## 2022-07-08 NOTE — Progress Notes (Signed)
[facility] [address] [place] [address] Dept: [phone] Dept Fax: [phone]  Office Visit  Subjective:    Patient ID: Chase [name], male    DOB: 12-03-1950, 72 y.o..   MRN: [id number]  Chief Complaint  Patient presents with   Medical Management of Chronic Issues    Elevated BS  averaging 403- 514 since not using insulin.     History of Present Illness:  Patient is in today for re-evaluation of his blood sugars.  Mr. Stephanie has a history of diabetes, diagnosed in his 5s. His record indicated Type 1 diabetes, however recent C-peptide testing confirms this is Type 2 diabetes. He had been managed on insulin lispro (Humalog). He was previously prescribed Lantus insulin, but found this to be too expensive and to cause a headache behind his left eye. At his last visit, I had him stop his insulin and started him on pioglitazone 15 mg daily and empagliflozin 10 mg daily. He has noted his blood sugars in the past 2 weeks to be significantly increased. I reviewed his home glucose monitor, his blood sugars have gone from a 90-day average int he low 200s to now a 7-day average of over 400.   Mr. Eggebrecht has a history of hypertension. He is managed on amlodipine 10 mg daily, doxazosin 4 mg daily, lisinopril 40 mg daily and chlorthalidone 25 mg daily (added at last visit).  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   Malignant neoplasm of prostate (HCC) 02/11/2019   Sleep apnea with use of continuous positive airway pressure (CPAP) 04/04/2016   Hypersomnia, persistent 06/03/2012   Class 2 obesity due to excess calories with body mass index (BMI) of 38.0 to 38.9 in adult 06/03/2012   History of colon polyps 06/05/2007   History of urinary  stone 06/05/2007   Resistant hypertension 06/05/2007   Hyperlipidemia 06/05/2007   Type 2 diabetes mellitus with renal complication (HCC) 06/05/2007   Past Surgical History:  Procedure Laterality Date   APPENDECTOMY  1972   PROSTATE BIOPSY     Family History  Problem Relation Age of Onset   Dementia Mother    Heart disease Father    Neuropathy Brother    Diabetes Maternal Aunt    Heart disease Paternal Uncle    Stomach cancer Neg Hx    Prostate cancer Neg Hx    Breast cancer Neg Hx    Pancreatic cancer Neg Hx    Outpatient Medications Prior to Visit  Medication Sig Dispense Refill   amLODipine (NORVASC) 10 MG tablet Take 1 tablet (10 mg total) by mouth daily. 90 tablet 3   aspirin 81 MG tablet Take 81 mg by mouth daily.     chlorthalidone (HYGROTON) 25 MG tablet Take 1 tablet (25 mg total) by mouth daily. 90 tablet 3   doxazosin (CARDURA) 4 MG tablet Take 1 tablet (4 mg total) by mouth daily. 90 tablet 3   fenofibrate (TRICOR) 48 MG tablet Take 1 tablet (48 mg total) by mouth daily. 90 tablet 3   lisinopril (ZESTRIL) 40 MG tablet Take 1 tablet (40 mg total) by mouth daily. 90 tablet 3   Multiple Vitamin (MULTIVITAMIN) tablet Take 1 tablet by mouth daily.     empagliflozin (JARDIANCE) 10 MG TABS tablet Take 1  tablet (10 mg total) by mouth daily before breakfast. 90 tablet 3   pioglitazone (ACTOS) 15 MG tablet Take 1 tablet (15 mg total) by mouth daily. 90 tablet 3   No facility-administered medications prior to visit.   Allergies  Allergen Reactions   Codeine Nausea And Vomiting   Metformin And Related Diarrhea     Objective:   Today's Vitals   07/08/22 1521  BP: 138/68  Pulse: 96  Temp: 98.4 F (36.9 C)  TempSrc: Temporal  SpO2: 99%  Weight: 246 lb (111.6 kg)  Height: 5\' 9"  (1.753 m)   Body mass index is 36.33 kg/m.   General: Well developed, well nourished. No acute distress. Psych: Alert and oriented. Normal mood and affect.  Health Maintenance Due   Topic Date Due   Medicare Annual Wellness (AWV)  Never done   Hepatitis C Screening  Never done   Pneumonia Vaccine 28+ Years old (1 of 1 - PCV) Never done   Colonoscopy  07/06/2016   DTaP/Tdap/Td (2 - Tdap) 03/28/2018     Assessment & Plan:   Problem List Items Addressed This Visit       Cardiovascular and Mediastinum   Resistant hypertension - Primary    BP improved with the addition of chlorthalidone. I will continue amlodipine 10 mg daily, doxazosin 4 mg daily, lisinopril 40 mg daily and chlorthalidone 25 mg daily. I will recheck this in 2 weeks. Plan BMP at next visit.        Endocrine   Type 2 diabetes mellitus with renal complication (HCC)    Significantly higher blood sugars. I will restart his insulin lispro, but have him take a consistent dose of 40 units with each meal. I will increase his pioglitazone to 30 mg daily and his empagliflozin to 25 mg daily. I will have him continue to monitor his blood sugars and see me back in 2 weeks.      Relevant Medications   insulin lispro (HUMALOG) 100 UNIT/ML injection   Insulin Syringe-Needle U-100 (INSULIN SYRINGE .3CC/29GX1/2") 29G X 1/2" 0.3 ML MISC   Insulin Pen Needle (NOVOFINE) 30G X 8 MM MISC   pioglitazone (ACTOS) 30 MG tablet   empagliflozin (JARDIANCE) 25 MG TABS tablet    Return in about 2 weeks (around 07/22/2022) for Reassessment.   Loyola Mast, MD

## 2022-07-08 NOTE — Assessment & Plan Note (Signed)
Significantly higher blood sugars. I will restart his insulin lispro, but have him take a consistent dose of 40 units with each meal. I will increase his pioglitazone to 30 mg daily and his empagliflozin to 25 mg daily. I will have him continue to monitor his blood sugars and see me back in 2 weeks.

## 2022-07-08 NOTE — Telephone Encounter (Signed)
Caller Name: Michaela Shankel Ph #: [phone] Chief Complaint: Since medication has changed on 6/12 his blood sugar levels have been between 400 and 500.    This call was transferred to Nurse Triage/Access Nurse. This is for documentation purposes. No follow up required at this time.

## 2022-07-08 NOTE — Telephone Encounter (Signed)
Called patient and scheduled him an appointment for 3:20 today.  Dm/cma

## 2022-07-22 ENCOUNTER — Ambulatory Visit (INDEPENDENT_AMBULATORY_CARE_PROVIDER_SITE_OTHER): Payer: Medicare Other | Admitting: Family Medicine

## 2022-07-22 ENCOUNTER — Encounter: Payer: Self-pay | Admitting: Family Medicine

## 2022-07-22 VITALS — BP 136/68 | HR 101 | Temp 97.1°F | Ht 69.0 in | Wt 250.0 lb

## 2022-07-22 DIAGNOSIS — E1129 Type 2 diabetes mellitus with other diabetic kidney complication: Secondary | ICD-10-CM

## 2022-07-22 DIAGNOSIS — Z794 Long term (current) use of insulin: Secondary | ICD-10-CM | POA: Diagnosis not present

## 2022-07-22 DIAGNOSIS — I1A0 Resistant hypertension: Secondary | ICD-10-CM | POA: Diagnosis not present

## 2022-07-22 DIAGNOSIS — R809 Proteinuria, unspecified: Secondary | ICD-10-CM

## 2022-07-22 DIAGNOSIS — N179 Acute kidney failure, unspecified: Secondary | ICD-10-CM

## 2022-07-22 LAB — BASIC METABOLIC PANEL
BUN: 28 mg/dL — ABNORMAL HIGH (ref 6–23)
CO2: 28 mEq/L (ref 19–32)
Calcium: 10.5 mg/dL (ref 8.4–10.5)
Chloride: 99 mEq/L (ref 96–112)
Creatinine, Ser: 1.49 mg/dL (ref 0.40–1.50)
GFR: 46.19 mL/min — ABNORMAL LOW (ref >60.00)
Glucose, Bld: 185 mg/dL — ABNORMAL HIGH (ref 70–99)
Potassium: 4.2 mEq/L (ref 3.5–5.1)
Sodium: 136 mEq/L (ref 135–145)

## 2022-07-22 MED ORDER — INSULIN LISPRO 100 UNIT/ML IJ SOLN: 30.0000 [IU] | Freq: Three times a day (TID) | INTRAMUSCULAR | 11 refills | Status: DC

## 2022-07-22 MED ORDER — METFORMIN HCL ER 500 MG PO TB24: ORAL_TABLET | ORAL | 2 refills | Status: DC

## 2022-07-22 NOTE — Assessment & Plan Note (Signed)
Chase [name] BP has been improved with the current regimen. I will continue amlodipine 10 mg daily, doxazosin 4 mg daily, lisinopril 40 mg daily and chlorthalidone 25 mg daily. I will check his BMP today.

## 2022-07-22 NOTE — Progress Notes (Signed)
[facility]ARE [facility] [address] [place] [address] Dept: [phone] Dept Fax: [phone]  Chronic Care Office Visit  Subjective:    Patient ID: Chase [name], male    DOB: [birth year], 72 y.o..   MRN: [id number]  Chief Complaint  Patient presents with   Medical Management of Chronic Issues    2 week f/u HTN.  Average BP 113-60's- 103/60's, 130-140 when up moving around.    History of Present Illness:  Patient is in today for reassessment of chronic medical issues.  Chase [name] has a history of Type 2 diabetes. I had tried stopping his insulin and prescribed pioglitazone and empagliflozin. As his sugars had significantly increased, I increased his dose of pioglitazone to 30 mg daily and empagliflozin 25 mg daily 2 weeks ago. I also recommended he restart his insulin lispro. He had developed an issue with a visual change, involving a swirling effect (like a pinwheel) in his left visual field. He stopped both of the oral diabetes meds and felt the issue went away. He is still on insulin lispro 30 units with each meal. He notes he had some diarrhea when using metformin some years ago. However, he would like to try this again and see if he tolerates it better.   Chase [name] has a history of hypertension. He is managed on amlodipine 10 mg daily, doxazosin 4 mg daily, lisinopril 40 mg daily and chlorthalidone 25 mg daily. His BP has been doing better on this regimen.  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   Malignant neoplasm of prostate (HCC) 02/11/2019   Sleep apnea with use of continuous positive airway pressure (CPAP) 04/04/2016   Hypersomnia, persistent 06/03/2012   Class 2 obesity due to excess calories with body mass index (BMI) of 38.0  to 38.9 in adult 06/03/2012   History of colon polyps 06/05/2007   History of urinary stone 06/05/2007   Resistant hypertension 06/05/2007   Hyperlipidemia 06/05/2007   Type 2 diabetes mellitus with renal complication (HCC) 06/05/2007   Past Surgical History:  Procedure Laterality Date   APPENDECTOMY  1972   PROSTATE BIOPSY     Family History  Problem Relation Age of Onset   Dementia Mother    Heart disease Father    Neuropathy Brother    Diabetes Maternal Aunt    Heart disease Paternal Uncle    Stomach cancer Neg Hx    Prostate cancer Neg Hx    Breast cancer Neg Hx    Pancreatic cancer Neg Hx    Outpatient Medications Prior to Visit  Medication Sig Dispense Refill   amLODipine (NORVASC) 10 MG tablet Take 1 tablet (10 mg total) by mouth daily. 90 tablet 3   aspirin 81 MG tablet Take 81 mg by mouth daily.     chlorthalidone (HYGROTON) 25 MG tablet Take 1 tablet (25 mg total) by mouth daily. 90 tablet 3   doxazosin (CARDURA) 4 MG tablet Take 1 tablet (4 mg total) by mouth daily. 90 tablet 3   fenofibrate (TRICOR) 48 MG tablet Take 1 tablet (48 mg total) by mouth daily. 90 tablet 3   insulin lispro (HUMALOG) 100 UNIT/ML injection Inject 0.4 mLs (40 Units total) into the skin 3 (three) times daily before meals. 10 mL 11   Insulin Pen  Needle (NOVOFINE) 30G X 8 MM MISC Inject 10 each into the skin as needed. 100 each 11   Insulin Syringe-Needle U-100 (INSULIN SYRINGE .3CC/29GX1/2") 29G X 1/2" 0.3 ML MISC Use as directed three times a day 100 each 11   lisinopril (ZESTRIL) 40 MG tablet Take 1 tablet (40 mg total) by mouth daily. 90 tablet 3   Multiple Vitamin (MULTIVITAMIN) tablet Take 1 tablet by mouth daily.     empagliflozin (JARDIANCE) 25 MG TABS tablet Take 1 tablet (25 mg total) by mouth daily before breakfast. (Patient not taking: Reported on 07/22/2022) 90 tablet 3   pioglitazone (ACTOS) 30 MG tablet Take 1 tablet (30 mg total) by mouth daily. (Patient not taking: Reported on  07/22/2022) 90 tablet 3   No facility-administered medications prior to visit.   Allergies  Allergen Reactions   Codeine Nausea And Vomiting   Metformin And Related Diarrhea   Objective:   Today's Vitals   07/22/22 0953  BP: 136/68  Pulse: (!) 101  Temp: (!) 97.1 F (36.2 C)  TempSrc: Temporal  SpO2: 96%  Weight: 250 lb (113.4 kg)  Height: 5\' 9"  (1.753 m)   Body mass index is 36.92 kg/m.   General: Well developed, well nourished. No acute distress. Psych: Alert and oriented. Normal mood and affect.  Health Maintenance Due  Topic Date Due   Medicare Annual Wellness (AWV)  Never done   Hepatitis C Screening  Never done   Pneumonia Vaccine 44+ Years old (1 of 1 - PCV) Never done   Colonoscopy  07/06/2016   DTaP/Tdap/Td (2 - Tdap) 03/28/2018     Assessment & Plan:   Problem List Items Addressed This Visit       Cardiovascular and Mediastinum   Resistant hypertension - Primary    Chase [name] BP has been improved with the current regimen. I will continue amlodipine 10 mg daily, doxazosin 4 mg daily, lisinopril 40 mg daily and chlorthalidone 25 mg daily. I will check his BMP today.      Relevant Orders   Basic metabolic panel     Endocrine   Type 2 diabetes mellitus with renal complication [facility])    Chase [name] may have had a reaction to the higher dose of pioglitazone or empagliflozin. He is willing to try metformin. I will start metformin XR, in hopes the longer acting formulation will reduce GI side effects. I will have him continue the insulin lispro for now. I will see him back in 1 month. I asked that he bring his monitor with him for me to review his blood sugar log.      Relevant Medications   metFORMIN (GLUCOPHAGE-XR) 500 MG 24 hr tablet   insulin lispro (HUMALOG) 100 UNIT/ML injection    Return in about 4 weeks (around 08/19/2022) for Reassessment.   Loyola Mast, MD

## 2022-07-22 NOTE — Addendum Note (Signed)
Addended by: Loyola Mast on: 07/22/2022 03:08 PM   Modules accepted: Orders

## 2022-07-22 NOTE — Assessment & Plan Note (Signed)
Chase [name] may have had a reaction to the higher dose of pioglitazone or empagliflozin. He is willing to try metformin. I will start metformin XR, in hopes the longer acting formulation will reduce GI side effects. I will have him continue the insulin lispro for now. I will see him back in 1 month. I asked that he bring his monitor with him for me to review his blood sugar log.

## 2022-07-24 DIAGNOSIS — E1165 Type 2 diabetes mellitus with hyperglycemia: Secondary | ICD-10-CM | POA: Diagnosis not present

## 2022-07-31 DIAGNOSIS — J342 Deviated nasal septum: Secondary | ICD-10-CM | POA: Insufficient documentation

## 2022-07-31 DIAGNOSIS — J343 Hypertrophy of nasal turbinates: Secondary | ICD-10-CM | POA: Diagnosis not present

## 2022-08-16 DIAGNOSIS — R42 Dizziness and giddiness: Secondary | ICD-10-CM | POA: Diagnosis not present

## 2022-08-19 ENCOUNTER — Ambulatory Visit (INDEPENDENT_AMBULATORY_CARE_PROVIDER_SITE_OTHER): Payer: Medicare Other | Admitting: Family Medicine

## 2022-08-19 ENCOUNTER — Encounter: Payer: Self-pay | Admitting: Family Medicine

## 2022-08-19 VITALS — BP 150/74 | HR 79 | Temp 98.1°F | Ht 69.0 in | Wt 254.0 lb

## 2022-08-19 DIAGNOSIS — R809 Proteinuria, unspecified: Secondary | ICD-10-CM

## 2022-08-19 DIAGNOSIS — I1A0 Resistant hypertension: Secondary | ICD-10-CM | POA: Diagnosis not present

## 2022-08-19 DIAGNOSIS — Z1159 Encounter for screening for other viral diseases: Secondary | ICD-10-CM | POA: Diagnosis not present

## 2022-08-19 DIAGNOSIS — Z794 Long term (current) use of insulin: Secondary | ICD-10-CM

## 2022-08-19 DIAGNOSIS — E1129 Type 2 diabetes mellitus with other diabetic kidney complication: Secondary | ICD-10-CM | POA: Diagnosis not present

## 2022-08-19 LAB — HEMOGLOBIN A1C: Hgb A1c MFr Bld: 9.6 % — ABNORMAL HIGH (ref 4.6–6.5)

## 2022-08-19 MED ORDER — LANTUS SOLOSTAR 100 UNIT/ML ~~LOC~~ SOPN: 20.0000 [IU] | PEN_INJECTOR | Freq: Every day | SUBCUTANEOUS | 99 refills | Status: DC

## 2022-08-19 NOTE — Assessment & Plan Note (Signed)
Chase [name] did not bring his monitor in today for review. The blood sugars he describes still sound very high. We discussed taking his insulin lispro 30 units 15 minutes prior to his meals. I will add Lantus insulin 20 units at bedtime. He will cotninue metformin XR 500 mg two tabs daily. I will check his A1c today.

## 2022-08-19 NOTE — Progress Notes (Signed)
[facility]ARE [facility] [address] [place] [address] Dept: [phone] Dept Fax: [phone]  Chronic Care Office Visit  Subjective:    Patient ID: Chase [name], male    DOB: Jul 02, 1950, 72 y.o..   MRN: [id number]  Chief Complaint  Patient presents with   Follow-up    4 week f/u HTN/DM.   Lowered Metformin due to loose stools.    History of Present Illness:  Patient is in today for reassessment of chronic medical issues.  Mr. Muramoto has a history of Type 2 diabetes. He is currently managed on insulin lispro 30 units with each meal and metformin XR 500 mg two tabs daily. He notes his home blood sugars are continuing to range between 250-400.   Mr. Venecia has a history of hypertension. He is managed on amlodipine 10 mg daily, doxazosin 4 mg daily, lisinopril 40 mg daily and chlorthalidone 25 mg daily.  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   Malignant neoplasm of prostate (HCC) 02/11/2019   Sleep apnea with use of continuous positive airway pressure (CPAP) 04/04/2016   Hypersomnia, persistent 06/03/2012   Class 2 obesity due to excess calories with body mass index (BMI) of 38.0 to 38.9 in adult 06/03/2012   History of colon polyps 06/05/2007   History of urinary stone 06/05/2007   Resistant hypertension 06/05/2007   Hyperlipidemia 06/05/2007   Type 2 diabetes mellitus with renal complication (HCC) 06/05/2007   Past Surgical History:  Procedure Laterality Date   APPENDECTOMY  1972   PROSTATE BIOPSY     Family History  Problem Relation Age of Onset   Dementia Mother    Heart disease Father    Neuropathy Brother    Diabetes Maternal Aunt    Heart disease Paternal Uncle    Stomach cancer  Neg Hx    Prostate cancer Neg Hx    Breast cancer Neg Hx    Pancreatic cancer Neg Hx    Outpatient Medications Prior to Visit  Medication Sig Dispense Refill   amLODipine (NORVASC) 10 MG tablet Take 1 tablet (10 mg total) by mouth daily. 90 tablet 3   aspirin 81 MG tablet Take 81 mg by mouth daily.     chlorthalidone (HYGROTON) 25 MG tablet Take 1 tablet (25 mg total) by mouth daily. 90 tablet 3   doxazosin (CARDURA) 4 MG tablet Take 1 tablet (4 mg total) by mouth daily. 90 tablet 3   fenofibrate (TRICOR) 48 MG tablet Take 1 tablet (48 mg total) by mouth daily. 90 tablet 3   fluticasone (FLONASE) 50 MCG/ACT nasal spray Place 2 sprays into both nostrils daily.     insulin lispro (HUMALOG) 100 UNIT/ML injection Inject 0.3 mLs (30 Units total) into the skin 3 (three) times daily before meals. 10 mL 11   Insulin Pen Needle (NOVOFINE) 30G X 8 MM MISC Inject 10 each into the skin as needed. 100 each 11   Insulin Syringe-Needle U-100 (INSULIN SYRINGE .3CC/29GX1/2") 29G X 1/2" 0.3 ML MISC Use as directed three times a day 100 each 11   lisinopril (ZESTRIL) 40 MG tablet Take 1 tablet (40 mg total) by mouth daily. 90 tablet 3   metFORMIN (GLUCOPHAGE-XR) 500 MG 24 hr tablet Take 1 tablet (500 mg total)  by mouth daily with breakfast for 14 days, THEN 2 tablets (1,000 mg total) daily. 60 tablet 2   Multiple Vitamin (MULTIVITAMIN) tablet Take 1 tablet by mouth daily.     No facility-administered medications prior to visit.   Allergies  Allergen Reactions   Codeine Nausea And Vomiting   Metformin And Related Diarrhea   Objective:   Today's Vitals   08/19/22 1041  BP: (!) 150/74  Pulse: 79  Temp: 98.1 F (36.7 C)  TempSrc: Temporal  SpO2: 97%  Weight: 254 lb (115.2 kg)  Height: 5\' 9"  (1.753 m)   Body mass index is 37.51 kg/m.   General: Well developed, well nourished. No acute distress. Psych: Alert and oriented. Normal mood and affect.  Health Maintenance Due  Topic Date Due    Medicare Annual Wellness (AWV)  Never done   Hepatitis C Screening  Never done   Pneumonia Vaccine 32+ Years old (1 of 1 - PCV) Never done   Colonoscopy  07/06/2016   DTaP/Tdap/Td (2 - Tdap) 03/28/2018     Assessment & Plan:   Problem List Items Addressed This Visit       Cardiovascular and Mediastinum   Resistant hypertension - Primary    Mr. Antoniewicz BP was elevated today. It had been better controlled at recent visits. Continue amlodipine 10 mg daily, doxazosin 4 mg daily, lisinopril 40 mg daily and chlorthalidone 25 mg daily. If elevated at his next visit, I would consider referral tot he [facility].        Endocrine   Type 2 diabetes mellitus with renal complication [facility])    Mr. Vienneau did not bring his monitor in today for review. The blood sugars he describes still sound very high. We discussed taking his insulin lispro 30 units 15 minutes prior to his meals. I will add Lantus insulin 20 units at bedtime. He will cotninue metformin XR 500 mg two tabs daily. I will check his A1c today.        Relevant Medications   insulin glargine (LANTUS SOLOSTAR) 100 UNIT/ML Solostar Pen   Other Relevant Orders   Hemoglobin A1c   Other Visit Diagnoses     Encounter for hepatitis C screening test for low risk patient       Relevant Orders   HCV Ab w Reflex to Quant PCR       Return in about 4 weeks (around 09/16/2022) for Reassessment.   Loyola Mast, MD

## 2022-08-19 NOTE — Assessment & Plan Note (Signed)
Chase [name] BP was elevated today. It had been better controlled at recent visits. Continue amlodipine 10 mg daily, doxazosin 4 mg daily, lisinopril 40 mg daily and chlorthalidone 25 mg daily. If elevated at his next visit, I would consider referral tot he [facility].

## 2022-08-20 LAB — HCV INTERPRETATION

## 2022-08-28 ENCOUNTER — Encounter (INDEPENDENT_AMBULATORY_CARE_PROVIDER_SITE_OTHER): Payer: Self-pay

## 2022-09-04 ENCOUNTER — Encounter (HOSPITAL_BASED_OUTPATIENT_CLINIC_OR_DEPARTMENT_OTHER): Payer: Self-pay | Admitting: Family

## 2022-09-04 ENCOUNTER — Ambulatory Visit (HOSPITAL_BASED_OUTPATIENT_CLINIC_OR_DEPARTMENT_OTHER): Payer: Medicare Other | Admitting: Family

## 2022-09-04 VITALS — BP 153/77 | HR 94 | Ht 69.0 in | Wt 259.0 lb

## 2022-09-04 DIAGNOSIS — G4733 Obstructive sleep apnea (adult) (pediatric): Secondary | ICD-10-CM

## 2022-09-04 DIAGNOSIS — I1A0 Resistant hypertension: Secondary | ICD-10-CM | POA: Diagnosis not present

## 2022-09-04 MED ORDER — VALSARTAN 160 MG PO TABS: 160.0000 mg | ORAL_TABLET | Freq: Every day | ORAL | 2 refills | Status: DC

## 2022-09-04 NOTE — Progress Notes (Signed)
[facility] Initial Assessment:    Date:  09/09/2022   ID:  Chase [name], DOB 06/15/1950, MRN [id number]  PCP:  Loyola Mast, MD  Cardiologist:  None  Nephrologist:  Referring MD: Loyola Mast, MD   CC: Hypertension  History of Present Illness:    Chase [name] is a 72 y.o. male with a hx of hypertension, DM2, OSA here to establish care in the [facility].   Carotid duplex 07/2920 biilateral 1-39% stenosis. 07/07/21 normal adrenals  Chase [name] was diagnosed with hypertension many years ago, cannot recall date.  Blood pressure checked with wrist cuff at home. Readings have been 150-185. he reports tobacco use previously having quit in 1988. he eats outside of the home and does not follow low sodium diet. He is retired and works as a Agricultural consultant at [employer]. His personal goal is to be on a regimen that controls his BP but is also manageable in terms of number of medications.   Reports no shortness of breath nor dyspnea on exertion. Reports no chest pain, pressure, or tightness. No edema, orthopnea, PND. Reports no palpitations.    Previous antihypertensives:   Past Medical History:  Diagnosis Date   Diabetes mellitus (HCC)    Hyperlipidemia    Hypersomnia    Hypertension    Neuropathy    hands and feet   Obesity    morbid   Other dysfunctions of sleep stages or arousal from sleep    Prostate cancer (HCC)    Right foot pain    Sleep apnea with use of continuous positive airway pressure (CPAP)    diagnosed spells as microsleep atacks, AHI  :105--on 06-16-12 ,    Snoring    Syncope and collapse     Past Surgical History:  Procedure Laterality Date   APPENDECTOMY  1972   PROSTATE BIOPSY      Current Medications: Current Meds  Medication Sig   amLODipine (NORVASC) 10 MG tablet Take 1 tablet (10 mg total) by mouth daily. (Patient not taking: Reported on 09/08/2022)   aspirin 81 MG tablet Take 81 mg by mouth daily.    chlorthalidone (HYGROTON) 25 MG tablet Take 1 tablet (25 mg total) by mouth daily.   doxazosin (CARDURA) 4 MG tablet Take 1 tablet (4 mg total) by mouth daily.   fluticasone (FLONASE) 50 MCG/ACT nasal spray Place 2 sprays into both nostrils daily.   insulin glargine (LANTUS SOLOSTAR) 100 UNIT/ML Solostar Pen Inject 20 Units into the skin at bedtime.   insulin lispro (HUMALOG) 100 UNIT/ML injection Inject 0.3 mLs (30 Units total) into the skin 3 (three) times daily before meals.   Insulin Syringe-Needle U-100 (INSULIN SYRINGE .3CC/29GX1/2") 29G X 1/2" 0.3 ML MISC Use as directed three times a day   valsartan (DIOVAN) 160 MG tablet Take 1 tablet (160 mg total) by mouth daily.   [DISCONTINUED] lisinopril (ZESTRIL) 40 MG tablet Take 1 tablet (40 mg total) by mouth daily.     Allergies:   Codeine and Metformin and related   Social History   Socioeconomic History   Marital status: Married    Spouse name: Aram Beecham   Number of children: 2   Years of education: 14   Highest education level: Not on file  Occupational History   Occupation: disabled  Tobacco Use   Smoking status: Former    Current packs/day: 0.00    Average packs/day: 2.0 packs/day for 20.0 years (40.0 ttl pk-yrs)  Types: Cigarettes    Start date: 06/23/1966    Quit date: 06/23/1986    Years since quitting: 36.2   Smokeless tobacco: Never  Vaping Use   Vaping status: Never Used  Substance and Sexual Activity   Alcohol use: No   Drug use: No   Sexual activity: Yes  Other Topics Concern   Not on file  Social History Narrative   Not on file   Social Determinants of Health   Financial Resource Strain: Low Risk  (09/08/2022)   Overall Financial Resource Strain (CARDIA)    Difficulty of Paying Living Expenses: Not hard at all  Food Insecurity: No Food Insecurity (09/08/2022)   Hunger Vital Sign    Worried About Running Out of Food in the Last Year: Never true    Ran Out of Food in the Last Year: Never true   Transportation Needs: No Transportation Needs (09/08/2022)   PRAPARE - Administrator, Civil Service (Medical): No    Lack of Transportation (Non-Medical): No  Physical Activity: Inactive (09/08/2022)   Exercise Vital Sign    Days of Exercise per Week: 0 days    Minutes of Exercise per Session: 0 min  Stress: No Stress Concern Present (09/08/2022)   [employer] of Occupational Health - Occupational Stress Questionnaire    Feeling of Stress : Not at all  Social Connections: Unknown (09/08/2022)   Social Connection and Isolation Panel [NHANES]    Frequency of Communication with Friends and Family: Three times a week    Frequency of Social Gatherings with Friends and Family: Never    Attends Religious Services: Patient declined    Database administrator or Organizations: No    Attends Engineer, structural: Never    Marital Status: Married     Family History: The patient's family history includes Dementia in his mother; Diabetes in his maternal aunt; Heart disease in his father and paternal uncle; Neuropathy in his brother. There is no history of Stomach cancer, Prostate cancer, Breast cancer, or Pancreatic cancer.  ROS:   Please see the history of present illness.     All other systems reviewed and are negative.  EKGs/Labs/Other Studies Reviewed:    EKG Interpretation Date/Time:  Thursday September 04 2022 10:56:49 EDT Ventricular Rate:  94 PR Interval:  144 QRS Duration:  94 QT Interval:  358 QTC Calculation: 447 R Axis:   2  Text Interpretation: Normal sinus rhythm Nonspecific ST and T wave abnormality Confirmed by Gillian Shields ([id number]) on 09/09/2022 8:39:14 PM    Recent Labs: 07/22/2022: BUN 28; Creatinine, Ser 1.49; Potassium 4.2; Sodium 136   Recent Lipid Panel    Component Value Date/Time   CHOL 219 (H) 05/27/2022 1054   TRIG 321.0 (H) 05/27/2022 1054   HDL 34.60 (L) 05/27/2022 1054   CHOLHDL 6 05/27/2022 1054   VLDL 64.2 (H) 05/27/2022 1054    LDLDIRECT 138.0 05/27/2022 1054    Physical Exam:   VS:  BP (!) 153/77 Comment: right arm  Pulse 94   Ht 5\' 9"  (1.753 m)   Wt 259 lb (117.5 kg)   BMI 38.25 kg/m  , BMI Body mass index is 38.25 kg/m. GENERAL:  Well appearing HEENT: Pupils equal round and reactive, fundi not visualized, oral mucosa unremarkable NECK:  No jugular venous distention, waveform within normal limits, carotid upstroke brisk and symmetric, no bruits, no thyromegaly LYMPHATICS:  No cervical adenopathy LUNGS:  Clear to auscultation bilaterally HEART:  RRR.  PMI  not displaced or sustained,S1 and S2 within normal limits, no S3, no S4, no clicks, no rubs, no murmurs ABD:  Flat, positive bowel sounds normal in frequency in pitch, no bruits, no rebound, no guarding, no midline pulsatile mass, no hepatomegaly, no splenomegaly EXT:  2 plus pulses throughout, no edema, no cyanosis no clubbing SKIN:  No rashes no nodules NEURO:  Cranial nerves II through XII grossly intact, motor grossly intact throughout PSYCH:  Cognitively intact, oriented to person place and time   ASSESSMENT/PLAN:    HTN - BP not at goal. Stop Lisinopril, start Valsartan 160mg  daily. Further up-titrate as needed. Ideally in future transition to Amlodipine-Valsartan combo tablet once dosing correct. In the interim, continue Amlodipine 10mg , Doxazosin, Chlorthalidone 25mg  daily.  Labs in 1-2 weeks BMP, renin-aldosterone, TSH.  He will bring BP cuff to next office visit to ensure accuracy. Discussed to monitor BP at home at least 2 hours after medications and sitting for 5-10 minutes.  OSA - Previously followed by Dr. Vickey Huger. Not addressed at this clinic visit, address at follow up.   Screening for Secondary Hypertension:     09/09/2022    8:41 PM  Causes  Thyroid Disease Screened     - Comments 08/2022 TSH ordered  Hyperaldosteronism Screened     - Comments 08/2022 renin-aldosterone ordered  Pheochromocytoma Screened     - Comments 06/2021  normal adrenals  Coarctation of the Aorta Screened     - Comments BP symmetric  Compliance Screened    Relevant Labs/Studies:    Latest Ref Rng & Units 07/22/2022   10:24 AM 05/27/2022   10:54 AM  Basic Labs  Sodium 135 - 145 mEq/L 136  134   Potassium 3.5 - 5.1 mEq/L 4.2  4.1   Creatinine 0.40 - 1.50 mg/dL 1.61  0.96        Latest Ref Rng & Units 09/24/2012    3:14 PM  Thyroid   TSH 0.450 - 4.500 uIU/mL 1.130                  Disposition:    FU with MD/PharmD in 2 months    Medication Adjustments/Labs and Tests Ordered: Current medicines are reviewed at length with the patient today.  Concerns regarding medicines are outlined above.  Orders Placed This Encounter  Procedures   Aldosterone + renin activity w/ ratio   Basic metabolic panel   TSH   EKG 12-Lead   Meds ordered this encounter  Medications   valsartan (DIOVAN) 160 MG tablet    Sig: Take 1 tablet (160 mg total) by mouth daily.    Dispense:  60 tablet    Refill:  2     Signed, Alver Sorrow, NP  09/09/2022 8:45 PM    [facility]

## 2022-09-04 NOTE — Patient Instructions (Addendum)
Medication Instructions:  Your physician has recommended you make the following change in your medication:   Stop Lisinopril   Start Valsartan 160mg  daily    Labwork: Your physician recommends that you return for lab work in 1-2 weeks for BMP, Renin-aldosterone and TSH at [facility]    Follow-Up: Follow up as scheduled   Special Instructions:  Please bring your blood pressure readings and cuff to your next appointment.

## 2022-09-08 ENCOUNTER — Ambulatory Visit (INDEPENDENT_AMBULATORY_CARE_PROVIDER_SITE_OTHER): Payer: Medicare Other

## 2022-09-08 DIAGNOSIS — Z0000 Encounter for general adult medical examination without abnormal findings: Secondary | ICD-10-CM | POA: Diagnosis not present

## 2022-09-08 NOTE — Progress Notes (Signed)
Subjective:   Chase [name] is a 72 y.o. male who presents for an Initial Medicare Annual Wellness Visit.  Visit Complete: Virtual  I connected with  [name] on 09/08/22 by a audio enabled telemedicine application and verified that I am speaking with the correct person using two identifiers.  Patient Location: Home  Provider Location: Office/Clinic  I discussed the limitations of evaluation and management by telemedicine. The patient expressed understanding and agreed to proceed.  Vital Signs: Unable to obtain new vitals due to this being a telehealth visit.  Review of Systems     Cardiac Risk Factors include: advanced age (>21men, >47 women);diabetes mellitus;dyslipidemia;hypertension;male gender     Objective:    Today's Vitals   There is no height or weight on file to calculate BMI.     09/08/2022    1:59 PM 02/11/2019   10:31 AM  Advanced Directives  Does Patient Have a Medical Advance Directive?  No  Would patient like information on creating a medical advance directive? No - Patient declined Yes (MAU/Ambulatory/Procedural Areas - Information given)    Current Medications (verified) Outpatient Encounter Medications as of 09/08/2022  Medication Sig   aspirin 81 MG tablet Take 81 mg by mouth daily.   chlorthalidone (HYGROTON) 25 MG tablet Take 1 tablet (25 mg total) by mouth daily.   doxazosin (CARDURA) 4 MG tablet Take 1 tablet (4 mg total) by mouth daily.   fluticasone (FLONASE) 50 MCG/ACT nasal spray Place 2 sprays into both nostrils daily.   insulin glargine (LANTUS SOLOSTAR) 100 UNIT/ML Solostar Pen Inject 20 Units into the skin at bedtime.   insulin lispro (HUMALOG) 100 UNIT/ML injection Inject 0.3 mLs (30 Units total) into the skin 3 (three) times daily before meals.   Insulin Syringe-Needle U-100 (INSULIN SYRINGE .3CC/29GX1/2") 29G X 1/2" 0.3 ML MISC Use as directed three times a day   valsartan (DIOVAN) 160 MG tablet Take 1 tablet (160 mg total) by mouth  daily.   amLODipine (NORVASC) 10 MG tablet Take 1 tablet (10 mg total) by mouth daily. (Patient not taking: Reported on 09/08/2022)   No facility-administered encounter medications on file as of 09/08/2022.    Allergies (verified) Codeine and Metformin and related   History: Past Medical History:  Diagnosis Date   Diabetes mellitus (HCC)    Hyperlipidemia    Hypersomnia    Hypertension    Neuropathy    hands and feet   Obesity    morbid   Other dysfunctions of sleep stages or arousal from sleep    Prostate cancer (HCC)    Right foot pain    Sleep apnea with use of continuous positive airway pressure (CPAP)    diagnosed spells as microsleep atacks, AHI  :105--on 06-16-12 ,    Snoring    Syncope and collapse    Past Surgical History:  Procedure Laterality Date   APPENDECTOMY  1972   PROSTATE BIOPSY     Family History  Problem Relation Age of Onset   Dementia Mother    Heart disease Father    Neuropathy Brother    Diabetes Maternal Aunt    Heart disease Paternal Uncle    Stomach cancer Neg Hx    Prostate cancer Neg Hx    Breast cancer Neg Hx    Pancreatic cancer Neg Hx    Social History   Socioeconomic History   Marital status: Married    Spouse name: Aram Beecham   Number of children: 2   Years of  education: 14   Highest education level: Not on file  Occupational History   Occupation: disabled  Tobacco Use   Smoking status: Former    Current packs/day: 0.00    Average packs/day: 2.0 packs/day for 20.0 years (40.0 ttl pk-yrs)    Types: Cigarettes    Start date: 06/23/1966    Quit date: 06/23/1986    Years since quitting: 36.2   Smokeless tobacco: Never  Vaping Use   Vaping status: Never Used  Substance and Sexual Activity   Alcohol use: No   Drug use: No   Sexual activity: Yes  Other Topics Concern   Not on file  Social History Narrative   Not on file   Social Determinants of Health   Financial Resource Strain: Low Risk  (09/08/2022)   Overall Financial  Resource Strain (CARDIA)    Difficulty of Paying Living Expenses: Not hard at all  Food Insecurity: No Food Insecurity (09/08/2022)   Hunger Vital Sign    Worried About Running Out of Food in the Last Year: Never true    Ran Out of Food in the Last Year: Never true  Transportation Needs: No Transportation Needs (09/08/2022)   PRAPARE - Administrator, Civil Service (Medical): No    Lack of Transportation (Non-Medical): No  Physical Activity: Inactive (09/08/2022)   Exercise Vital Sign    Days of Exercise per Week: 0 days    Minutes of Exercise per Session: 0 min  Stress: No Stress Concern Present (09/08/2022)   [employer] of Occupational Health - Occupational Stress Questionnaire    Feeling of Stress : Not at all  Social Connections: Unknown (09/08/2022)   Social Connection and Isolation Panel [NHANES]    Frequency of Communication with Friends and Family: Three times a week    Frequency of Social Gatherings with Friends and Family: Never    Attends Religious Services: Patient declined    Database administrator or Organizations: No    Attends Engineer, structural: Never    Marital Status: Married    Tobacco Counseling Counseling given: Not Answered   Clinical Intake:  Pre-visit preparation completed: Yes  Pain : No/denies pain     Nutritional Risks: None Diabetes: Yes CBG done?: No Did pt. bring in CBG monitor from home?: No  How often do you need to have someone help you when you read instructions, pamphlets, or other written materials from your doctor or pharmacy?: 1 - Never  Interpreter Needed?: No  Information entered by :: NAllen LPN   Activities of Daily Living    09/08/2022    1:56 PM  In your present state of health, do you have any difficulty performing the following activities:  Hearing? 0  Vision? 0  Difficulty concentrating or making decisions? 0  Walking or climbing stairs? 0  Dressing or bathing? 0  Doing errands,  shopping? 0  Preparing Food and eating ? N  Using the Toilet? N  In the past six months, have you accidently leaked urine? N  Do you have problems with loss of bowel control? N  Managing your Medications? N  Managing your Finances? N  Housekeeping or managing your Housekeeping? N    Patient Care Team: Loyola Mast, MD as PCP - General (Family Medicine) Marcine Matar, MD as Consulting Physician (Urology) Margaretmary Dys, MD as Consulting Physician (Radiation Oncology)  Indicate any recent Medical Services you may have received from other than [facility] providers in the past year (date  may be approximate).     Assessment:   This is a routine wellness examination for Curtis.  Hearing/Vision screen Hearing Screening - Comments:: Denies hearing issues Vision Screening - Comments:: Regular eye xams  Dietary issues and exercise activities discussed:     Goals Addressed             This Visit's Progress    Patient Stated       09/08/2022 denies health goals       Depression Screen    09/08/2022    2:00 PM 05/27/2022    9:54 AM  PHQ 2/9 Scores  PHQ - 2 Score 0 0    Fall Risk    09/08/2022    2:00 PM 05/27/2022    9:54 AM  Fall Risk   Falls in the past year? 0 0  Number falls in past yr: 0 0  Injury with Fall? 0 0  Risk for fall due to : Medication side effect No Fall Risks  Follow up Falls prevention discussed;Falls evaluation completed Falls evaluation completed    MEDICARE RISK AT HOME: Medicare Risk at Home Any stairs in or around the home?: Yes If so, are there any without handrails?: No Home free of loose throw rugs in walkways, pet beds, electrical cords, etc?: Yes Adequate lighting in your home to reduce risk of falls?: Yes Life alert?: No Use of a cane, walker or w/c?: No Grab bars in the bathroom?: No Shower chair or bench in shower?: No Elevated toilet seat or a handicapped toilet?: No  TIMED UP AND GO:  Was the test performed? No     Cognitive Function:        09/08/2022    2:00 PM  6CIT Screen  What Year? 0 points  What month? 0 points  What time? 0 points  Count back from 20 0 points  Months in reverse 0 points  Repeat phrase 0 points  Total Score 0 points    Immunizations Immunization History  Administered Date(s) Administered   Influenza-Unspecified 11/13/2012   Moderna Covid-19 Vaccine Bivalent Booster 68yrs & up 10/10/2020   Moderna Sars-Covid-2 Vaccination 03/11/2019, 04/08/2019   Td 03/27/2008    TDAP status: Due, Education has been provided regarding the importance of this vaccine. Advised may receive this vaccine at local pharmacy or Health Dept. Aware to provide a copy of the vaccination record if obtained from local pharmacy or Health Dept. Verbalized acceptance and understanding.  Flu Vaccine status: Declined, Education has been provided regarding the importance of this vaccine but patient still declined. Advised may receive this vaccine at local pharmacy or Health Dept. Aware to provide a copy of the vaccination record if obtained from local pharmacy or Health Dept. Verbalized acceptance and understanding.  Pneumococcal vaccine status: Declined,  Education has been provided regarding the importance of this vaccine but patient still declined. Advised may receive this vaccine at local pharmacy or Health Dept. Aware to provide a copy of the vaccination record if obtained from local pharmacy or Health Dept. Verbalized acceptance and understanding.   Covid-19 vaccine status: Information provided on how to obtain vaccines.   Qualifies for Shingles Vaccine? Yes   Zostavax completed No   Shingrix Completed?: No.    Education has been provided regarding the importance of this vaccine. Patient has been advised to call insurance company to determine out of pocket expense if they have not yet received this vaccine. Advised may also receive vaccine at local pharmacy or Health Dept. Verbalized  acceptance and  understanding.  Screening Tests Health Maintenance  Topic Date Due   Zoster Vaccines- Shingrix (1 of 2) Never done   Pneumonia Vaccine 22+ Years old (1 of 1 - PCV) Never done   DTaP/Tdap/Td (2 - Tdap) 03/28/2018   COVID-19 Vaccine (4 - 2023-24 season) 09/13/2021   INFLUENZA VACCINE  09/15/2022 (Originally 08/14/2022)   Colonoscopy  08/19/2023 (Originally 07/06/2016)   HEMOGLOBIN A1C  02/19/2023   Diabetic kidney evaluation - Urine ACR  05/27/2023   FOOT EXAM  05/27/2023   OPHTHALMOLOGY EXAM  06/10/2023   Diabetic kidney evaluation - eGFR measurement  07/22/2023   Medicare Annual Wellness (AWV)  09/08/2023   Hepatitis C Screening  Completed   HPV VACCINES  Aged Out    Health Maintenance  Health Maintenance Due  Topic Date Due   Zoster Vaccines- Shingrix (1 of 2) Never done   Pneumonia Vaccine 16+ Years old (1 of 1 - PCV) Never done   DTaP/Tdap/Td (2 - Tdap) 03/28/2018   COVID-19 Vaccine (4 - 2023-24 season) 09/13/2021    Colorectal cancer screening: decline  Lung Cancer Screening: (Low Dose CT Chest recommended if Age 36-80 years, 20 pack-year currently smoking OR have quit w/in 15years.) does not qualify.   Lung Cancer Screening Referral: no  Additional Screening:  Hepatitis C Screening: does qualify; Completed 08/19/2022  Vision Screening: Recommended annual ophthalmology exams for early detection of glaucoma and other disorders of the eye. Is the patient up to date with their annual eye exam?  No  Who is the provider or what is the name of the office in which the patient attends annual eye exams? none If pt is not established with a provider, would they like to be referred to a provider to establish care? No .   Dental Screening: Recommended annual dental exams for proper oral hygiene  Diabetic Foot Exam:   Community Resource Referral / Chronic Care Management: CRR required this visit?  No   CCM required this visit?  No    Plan:     I have personally reviewed  and noted the following in the patient's chart:   Medical and social history Use of alcohol, tobacco or illicit drugs  Current medications and supplements including opioid prescriptions. Patient is not currently taking opioid prescriptions. Functional ability and status Nutritional status Physical activity Advanced directives List of other physicians Hospitalizations, surgeries, and ER visits in previous 12 months Vitals Screenings to include cognitive, depression, and falls Referrals and appointments  In addition, I have reviewed and discussed with patient certain preventive protocols, quality metrics, and best practice recommendations. A written personalized care plan for preventive services as well as general preventive health recommendations were provided to patient.     Barb Merino, LPN   [id number]   After Visit Summary: (MyChart) Due to this being a telephonic visit, the after visit summary with patients personalized plan was offered to patient via MyChart   Nurse Notes: none

## 2022-09-08 NOTE — Patient Instructions (Signed)
Chase [name] , Thank you for taking time to come for your Medicare Wellness Visit. I appreciate your ongoing commitment to your health goals. Please review the following plan we discussed and let me know if I can assist you in the future.   Referrals/Orders/Follow-Ups/Clinician Recommendations: none  This is a list of the screening recommended for you and due dates:  Health Maintenance  Topic Date Due   Zoster (Shingles) Vaccine (1 of 2) Never done   Pneumonia Vaccine (1 of 1 - PCV) Never done   DTaP/Tdap/Td vaccine (2 - Tdap) 03/28/2018   COVID-19 Vaccine (4 - 2023-24 season) 09/13/2021   Flu Shot  09/15/2022*   Colon Cancer Screening  08/19/2023*   Hemoglobin A1C  02/19/2023   Yearly kidney health urinalysis for diabetes  05/27/2023   Complete foot exam   05/27/2023   Eye exam for diabetics  06/10/2023   Yearly kidney function blood test for diabetes  07/22/2023   Medicare Annual Wellness Visit  09/08/2023   Hepatitis C Screening  Completed   HPV Vaccine  Aged Out  *Topic was postponed. The date shown is not the original due date.    Advanced directives: (Declined) Advance directive discussed with you today. Even though you declined this today, please call our office should you change your mind, and we can give you the proper paperwork for you to fill out.  Next Medicare Annual Wellness Visit scheduled for next year: No, patient declined. States he does not want to do these calls anymore.  insert Preventive Care attachment Insert FALL PREVENTION attachment if needed

## 2022-09-09 ENCOUNTER — Encounter (HOSPITAL_BASED_OUTPATIENT_CLINIC_OR_DEPARTMENT_OTHER): Payer: Self-pay | Admitting: Family

## 2022-09-16 ENCOUNTER — Encounter: Payer: Self-pay | Admitting: Family Medicine

## 2022-09-16 ENCOUNTER — Ambulatory Visit (INDEPENDENT_AMBULATORY_CARE_PROVIDER_SITE_OTHER): Payer: Medicare Other | Admitting: Family Medicine

## 2022-09-16 VITALS — BP 146/78 | HR 78 | Temp 98.3°F | Ht 69.0 in | Wt 254.8 lb

## 2022-09-16 DIAGNOSIS — E1129 Type 2 diabetes mellitus with other diabetic kidney complication: Secondary | ICD-10-CM

## 2022-09-16 DIAGNOSIS — I1A0 Resistant hypertension: Secondary | ICD-10-CM | POA: Diagnosis not present

## 2022-09-16 DIAGNOSIS — Z794 Long term (current) use of insulin: Secondary | ICD-10-CM

## 2022-09-16 MED ORDER — LANTUS SOLOSTAR 100 UNIT/ML ~~LOC~~ SOPN: 22.0000 [IU] | PEN_INJECTOR | Freq: Every day | SUBCUTANEOUS | 99 refills | Status: DC

## 2022-09-16 NOTE — Assessment & Plan Note (Signed)
Blood sugars remain quite high. I will have him start to titrate up on his Lantus dose until we are achieving fasting glucoses levels < 150.

## 2022-09-16 NOTE — Progress Notes (Signed)
[facility] [address] [place] [address] Dept: [phone] Dept Fax: [phone]  Chronic Care Office Visit  Subjective:    Patient ID: Chase [name], male    DOB: January 22, 1950, 71 y.o..   MRN: [id number]  Chief Complaint  Patient presents with   Hypertension    4 week f/u HTN.     History of Present Illness:  Patient is in today for reassessment of chronic medical issues.  Chase [name] has a history of Type 2 diabetes. He is currently managed on Insulin glargine (Lantus) 20 units (added at last visit) at bedtime, insulin lispro 30 units with each meal and metformin XR 500 mg two tabs daily. He notes his home blood sugars are continuing to range between 250-400.   Chase [name] has a history of hypertension. He is managed on amlodipine 10 mg daily, doxazosin 4 mg daily, and chlorthalidone 25 mg daily. He had been on lisinopril. [facility] him to valsartan 160 mg daily at his visit 2 weeks ago.  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   Malignant neoplasm of prostate (HCC) 02/11/2019   Sleep apnea with use of continuous positive airway pressure (CPAP) 04/04/2016   Hypersomnia, persistent 06/03/2012   Class 2 obesity due to excess calories with body mass index (BMI) of 38.0 to 38.9 in adult 06/03/2012   History of colon polyps 06/05/2007   History of urinary stone 06/05/2007   Resistant hypertension 06/05/2007   Hyperlipidemia 06/05/2007   Type 2 diabetes mellitus with renal complication (HCC) 06/05/2007   Past Surgical History:  Procedure Laterality Date   APPENDECTOMY  1972   PROSTATE BIOPSY     Family History  Problem Relation Age of Onset   Dementia  Mother    Heart disease Father    Neuropathy Brother    Diabetes Maternal Aunt    Heart disease Paternal Uncle    Stomach cancer Neg Hx    Prostate cancer Neg Hx    Breast cancer Neg Hx    Pancreatic cancer Neg Hx    Outpatient Medications Prior to Visit  Medication Sig Dispense Refill   amLODipine (NORVASC) 10 MG tablet Take 1 tablet (10 mg total) by mouth daily. 90 tablet 3   aspirin 81 MG tablet Take 81 mg by mouth daily.     chlorthalidone (HYGROTON) 25 MG tablet Take 1 tablet (25 mg total) by mouth daily. 90 tablet 3   doxazosin (CARDURA) 4 MG tablet Take 1 tablet (4 mg total) by mouth daily. 90 tablet 3   fluticasone (FLONASE) 50 MCG/ACT nasal spray Place 2 sprays into both nostrils daily.     insulin glargine (LANTUS SOLOSTAR) 100 UNIT/ML Solostar Pen Inject 20 Units into the skin at bedtime. 15 mL PRN   insulin lispro (HUMALOG) 100 UNIT/ML injection Inject 0.3 mLs (30 Units total) into the skin 3 (three) times daily before meals. 10 mL 11   Insulin Syringe-Needle U-100 (INSULIN SYRINGE .3CC/29GX1/2") 29G X 1/2" 0.3 ML MISC Use as directed three times a day 100 each 11   valsartan (DIOVAN) 160 MG tablet Take 1 tablet (160 mg total) by mouth daily. 60 tablet 2   No facility-administered medications prior to visit.   Allergies  Allergen  Reactions   Codeine Nausea And Vomiting   Metformin And Related Diarrhea   Objective:   Today's Vitals   09/16/22 0913  BP: (!) 146/78  Pulse: 78  Temp: 98.3 F (36.8 C)  TempSrc: Temporal  SpO2: 97%  Weight: 254 lb 12.8 oz (115.6 kg)  Height: 5\' 9"  (1.753 m)   Body mass index is 37.63 kg/m.   General: Well developed, well nourished. No acute distress. Psych: Alert and oriented. Normal mood and affect.  Home blood glucose:  7-day average: 306  14-day average: 302  30-day average: 285  Health Maintenance Due  Topic Date Due   Pneumonia Vaccine 50+ Years old (1 of 1 - PCV) Never done   DTaP/Tdap/Td (2 - Tdap) 03/28/2018      Assessment & Plan:   Problem List Items Addressed This Visit       Cardiovascular and Mediastinum   Resistant hypertension - Primary    Chase [name] BP remains above goal, but is improved form his previous levels. Continue amlodipine 10 mg daily, doxazosin 4 mg daily, valsartan 160 mg daily and chlorthalidone 25 mg daily. [facility].        Endocrine   Type 2 diabetes mellitus with renal complication (HCC)    Blood sugars remain quite high. I will have him start to titrate up on his Lantus dose until we are achieving fasting glucoses levels < 150.      Relevant Medications   insulin glargine (LANTUS SOLOSTAR) 100 UNIT/ML Solostar Pen    Return in about 6 weeks (around 10/28/2022) for Reassessment.   Loyola Mast, MD

## 2022-09-16 NOTE — Assessment & Plan Note (Signed)
Chase [name] BP remains above goal, but is improved form his previous levels. Continue amlodipine 10 mg daily, doxazosin 4 mg daily, valsartan 160 mg daily and chlorthalidone 25 mg daily. Continue to follow with [facility].

## 2022-09-16 NOTE — Patient Instructions (Signed)
Start taking 22 units of insulin glargine (Lantus) each evening. Increase insulin glargine dose by 2 units each week as long as your fasting glucose is > 150. Maximum insulin glargine dose will be 80 units daily.

## 2022-09-18 DIAGNOSIS — I1A0 Resistant hypertension: Secondary | ICD-10-CM | POA: Diagnosis not present

## 2022-09-25 ENCOUNTER — Ambulatory Visit: Payer: Medicare Other | Admitting: Family Medicine

## 2022-10-02 ENCOUNTER — Encounter (HOSPITAL_BASED_OUTPATIENT_CLINIC_OR_DEPARTMENT_OTHER): Payer: Self-pay | Admitting: *Deleted

## 2022-10-02 LAB — BASIC METABOLIC PANEL
BUN/Creatinine Ratio: 16 (ref 10–24)
BUN: 21 mg/dL (ref 8–27)
CO2: 24 mmol/L (ref 20–29)
Calcium: 10 mg/dL (ref 8.6–10.2)
Chloride: 98 mmol/L (ref 96–106)
Creatinine, Ser: 1.32 mg/dL — ABNORMAL HIGH (ref 0.76–1.27)
Glucose: 252 mg/dL — ABNORMAL HIGH (ref 70–99)
Potassium: 4.9 mmol/L (ref 3.5–5.2)
Sodium: 137 mmol/L (ref 134–144)
eGFR: 57 mL/min/{1.73_m2} — ABNORMAL LOW (ref >59)

## 2022-10-02 LAB — TSH: TSH: 1.11 u[IU]/mL (ref 0.450–4.500)

## 2022-10-02 LAB — ALDOSTERONE + RENIN ACTIVITY W/ RATIO
Aldos/Renin Ratio: 1.3 (ref 0.0–30.0)
Aldosterone: 6.1 ng/dL (ref 0.0–30.0)
Renin Activity, Plasma: 4.827 ng/mL/hr (ref 0.167–5.380)

## 2022-10-28 ENCOUNTER — Ambulatory Visit: Payer: Medicare Other | Admitting: Family Medicine

## 2022-10-28 ENCOUNTER — Encounter: Payer: Self-pay | Admitting: Family Medicine

## 2022-10-28 VITALS — BP 158/76 | HR 80 | Temp 98.3°F | Ht 69.0 in | Wt 260.0 lb

## 2022-10-28 DIAGNOSIS — Z794 Long term (current) use of insulin: Secondary | ICD-10-CM

## 2022-10-28 DIAGNOSIS — I1A0 Resistant hypertension: Secondary | ICD-10-CM | POA: Diagnosis not present

## 2022-10-28 DIAGNOSIS — L918 Other hypertrophic disorders of the skin: Secondary | ICD-10-CM

## 2022-10-28 DIAGNOSIS — E782 Mixed hyperlipidemia: Secondary | ICD-10-CM | POA: Diagnosis not present

## 2022-10-28 DIAGNOSIS — E1129 Type 2 diabetes mellitus with other diabetic kidney complication: Secondary | ICD-10-CM

## 2022-10-28 DIAGNOSIS — R809 Proteinuria, unspecified: Secondary | ICD-10-CM | POA: Diagnosis not present

## 2022-10-28 LAB — GLUCOSE, RANDOM: Glucose, Bld: 171 mg/dL — ABNORMAL HIGH (ref 70–99)

## 2022-10-28 LAB — HEMOGLOBIN A1C: Hgb A1c MFr Bld: 8.8 % — ABNORMAL HIGH (ref 4.6–6.5)

## 2022-10-28 MED ORDER — INSULIN GLARGINE 100 UNIT/ML ~~LOC~~ SOLN: SUBCUTANEOUS | 11 refills | Status: DC

## 2022-10-28 MED ORDER — VALSARTAN 320 MG PO TABS: 320.0000 mg | ORAL_TABLET | Freq: Every day | ORAL | 3 refills | Status: DC

## 2022-10-28 NOTE — Assessment & Plan Note (Signed)
Blood sugars remain quite high. I will continue to titrate up on his Lantus dose until we are achieving fasting glucoses levels < 150. I discussed consideration of adding a GLP-1 RA to his therapy. We also discussed referral to endocrinology, as we are having difficulty making progress with his current regimens.

## 2022-10-28 NOTE — Assessment & Plan Note (Signed)
Chase [name] BP remains above goal. Continue amlodipine 10 mg daily, doxazosin 4 mg daily, and chlorthalidone 25 mg daily. I will increase his valsartan to 320 mg daily. I recommend he keep his follow-up appointment with [facility] next week.

## 2022-10-28 NOTE — Progress Notes (Signed)
[facility]IMARY [facility] [address] [place] [address] Dept: [phone] Dept Fax: [phone]  Chronic Care Office Visit  Subjective:    Patient ID: Chase [name], male    DOB: [birth year], 72 y.o..   MRN: [id number]  Chief Complaint  Patient presents with   Hypertension    6 week f/u, c/o having skin tags under RT arm.    History of Present Illness:  Patient is in today for reassessment of chronic medical issues.  Chase [name] has a history of Type 2 diabetes. He is currently managed on insulin glargine (Lantus) 42 units at bedtime and insulin lispro 40 units with each meal. I have had him titrating up on his Lantus with a goal to get his fasting glucoses under 150. He feels his diet may be playing a role in all of this. He is active doing golfing most days. I had tried him on metformin, but it appears he is no longer taking this.   Chase [name] has a history of hypertension. He is managed on amlodipine 10 mg daily, doxazosin 4 mg daily, chlorthalidone 25 mg daily, and valsartan 160 mg daily. He is now engaged with [facility] and is scheduled there on 10/24.  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   Malignant neoplasm of prostate (HCC) 02/11/2019   Sleep apnea with use of continuous positive airway pressure (CPAP) 04/04/2016   Hypersomnia, persistent 06/03/2012   Class 2 obesity due to excess calories with body mass index (BMI) of 38.0 to 38.9 in adult 06/03/2012   History of colon polyps 06/05/2007   History of urinary stone 06/05/2007   Resistant hypertension 06/05/2007   Hyperlipidemia 06/05/2007   Type 2 diabetes mellitus with renal complication (HCC) 06/05/2007   Past  Surgical History:  Procedure Laterality Date   APPENDECTOMY  1972   PROSTATE BIOPSY     Family History  Problem Relation Age of Onset   Dementia Mother    Heart disease Father    Neuropathy Brother    Diabetes Maternal Aunt    Heart disease Paternal Uncle    Stomach cancer Neg Hx    Prostate cancer Neg Hx    Breast cancer Neg Hx    Pancreatic cancer Neg Hx    Outpatient Medications Prior to Visit  Medication Sig Dispense Refill   amLODipine (NORVASC) 10 MG tablet Take 1 tablet (10 mg total) by mouth daily. 90 tablet 3   aspirin 81 MG tablet Take 81 mg by mouth daily.     chlorthalidone (HYGROTON) 25 MG tablet Take 1 tablet (25 mg total) by mouth daily. 90 tablet 3   doxazosin (CARDURA) 4 MG tablet Take 1 tablet (4 mg total) by mouth daily. 90 tablet 3   fluticasone (FLONASE) 50 MCG/ACT nasal spray Place 2 sprays into both nostrils daily.     insulin glargine (LANTUS SOLOSTAR) 100 UNIT/ML Solostar Pen Inject 22 Units into the skin at bedtime. Titrate up by 2 units each week (maximum 80 units/day) until fasting glucose < 150. 15 mL PRN   insulin lispro (HUMALOG) 100 UNIT/ML injection Inject 0.3 mLs (30 Units total) into the skin 3 (three) times daily before meals. 10 mL 11   Insulin Syringe-Needle U-100 (INSULIN SYRINGE .3CC/29GX1/2")  29G X 1/2" 0.3 ML MISC Use as directed three times a day 100 each 11   valsartan (DIOVAN) 160 MG tablet Take 1 tablet (160 mg total) by mouth daily. 60 tablet 2   No facility-administered medications prior to visit.   Allergies  Allergen Reactions   Codeine Nausea And Vomiting   Metformin And Related Diarrhea   Objective:   Today's Vitals   10/28/22 0859 10/28/22 0933  BP: (!) 164/78 (!) 158/76  Pulse: 80   Temp: 98.3 F (36.8 C)   TempSrc: Temporal   SpO2: 98%   Weight: 260 lb (117.9 kg)   Height: 5\' 9"  (1.753 m)    Body mass index is 38.4 kg/m.   General: Well developed, well nourished. No acute distress. Psych: Alert and oriented.  Normal mood and affect.  Health Maintenance Due  Topic Date Due   Pneumonia Vaccine 77+ Years old (1 of 1 - PCV) Never done   DTaP/Tdap/Td (2 - Tdap) 03/28/2018     Assessment & Plan:   Problem List Items Addressed This Visit       Cardiovascular and Mediastinum   Resistant hypertension - Primary    Mr. Cost BP remains above goal. Continue amlodipine 10 mg daily, doxazosin 4 mg daily, and chlorthalidone 25 mg daily. I will increase his valsartan to 320 mg daily. I recommend he keep his follow-up appointment with the [facility] next week.      Relevant Medications   valsartan (DIOVAN) 320 MG tablet     Endocrine   Type 2 diabetes mellitus with renal complication (HCC)    Blood sugars remain quite high. I will continue to titrate up on his Lantus dose until we are achieving fasting glucoses levels < 150. I discussed consideration of adding a GLP-1 RA to his therapy. We also discussed referral to endocrinology, as we are having difficulty making progress with his current regimens.      Relevant Medications   valsartan (DIOVAN) 320 MG tablet   insulin glargine (LANTUS) 100 UNIT/ML injection   Other Relevant Orders   Glucose, random   Hemoglobin A1c   Ambulatory referral to Endocrinology     Other   Hyperlipidemia   Relevant Medications   valsartan (DIOVAN) 320 MG tablet   Other Visit Diagnoses     Multiple acquired skin tags       Multiple skin tags present in axillae. He will check to see if having these removed are covered by his insurance and consider return for this procedure.       Return in about 3 months (around 01/28/2023) for Reassessment.   Chase Mast, MD

## 2022-11-06 ENCOUNTER — Ambulatory Visit (HOSPITAL_BASED_OUTPATIENT_CLINIC_OR_DEPARTMENT_OTHER): Payer: Medicare Other | Admitting: Family

## 2022-11-06 ENCOUNTER — Encounter (HOSPITAL_BASED_OUTPATIENT_CLINIC_OR_DEPARTMENT_OTHER): Payer: Self-pay | Admitting: Family

## 2022-11-06 VITALS — BP 162/70 | HR 69 | Ht 68.0 in | Wt 261.9 lb

## 2022-11-06 DIAGNOSIS — E1165 Type 2 diabetes mellitus with hyperglycemia: Secondary | ICD-10-CM

## 2022-11-06 DIAGNOSIS — I10 Essential (primary) hypertension: Secondary | ICD-10-CM | POA: Diagnosis not present

## 2022-11-06 DIAGNOSIS — Z794 Long term (current) use of insulin: Secondary | ICD-10-CM

## 2022-11-06 DIAGNOSIS — G4733 Obstructive sleep apnea (adult) (pediatric): Secondary | ICD-10-CM | POA: Diagnosis not present

## 2022-11-06 DIAGNOSIS — I6523 Occlusion and stenosis of bilateral carotid arteries: Secondary | ICD-10-CM

## 2022-11-06 LAB — BASIC METABOLIC PANEL
BUN/Creatinine Ratio: 18 (ref 10–24)
BUN: 20 mg/dL (ref 8–27)
CO2: 22 mmol/L (ref 20–29)
Calcium: 9.8 mg/dL (ref 8.6–10.2)
Chloride: 97 mmol/L (ref 96–106)
Creatinine, Ser: 1.12 mg/dL (ref 0.76–1.27)
Glucose: 218 mg/dL — ABNORMAL HIGH (ref 70–99)
Potassium: 4.1 mmol/L (ref 3.5–5.2)
Sodium: 137 mmol/L (ref 134–144)
eGFR: 69 mL/min/{1.73_m2} (ref >59)

## 2022-11-06 MED ORDER — DOXAZOSIN MESYLATE 8 MG PO TABS: 8.0000 mg | ORAL_TABLET | Freq: Every day | ORAL | 3 refills | Status: DC

## 2022-11-06 NOTE — Progress Notes (Signed)
[facility] Assessment:    Date:  11/06/2022   ID:  Chase [name], DOB 02-06-1950, MRN [id number]  PCP:  Loyola Mast, MD  Cardiologist:  Chilton Si, MD  Nephrologist:  Referring MD: Loyola Mast, MD   CC: Hypertension  History of Present Illness:    Chase [name] is a 72 y.o. male with a hx of hypertension, DM2, OSA here to follow up in the [facility].   Carotid duplex 07/2920 biilateral 1-39% stenosis. 07/07/21 normal adrenals.  Established with [facility] 09/04/22. Chase [name] was diagnosed with hypertension many years ago, could not recall date.  Home readings 150-180s on wrist cuff. Quit tobacco in 1988. Not following low sodium diet. Lisinopril was transitioned to Valsartan. Renin-aldosterone, TSH were unremarkable.  Presents today for follow up. He is retired and works as a Agricultural consultant at [employer]. His personal goal is to be on a regimen that controls his BP but is also manageable in terms of number of medications. BP 161/75 with repeat 162/70 today. Intermittently checking BP at home, runs 140s-160s. PCP increased valsartan to 320 mg daily last week. Wearing CPAP every night. No formal exercise routine, eating out a lot. I.e. yesterday [employer] for lunch and deli meat sandwich for dinner. Reports no shortness of breath nor dyspnea on exertion. Reports no chest pain, pressure, or tightness. No edema, orthopnea, PND. Reports no palpitations.   Previous antihypertensives:   Past Medical History:  Diagnosis Date   Diabetes mellitus (HCC)    Hyperlipidemia    Hypersomnia    Hypertension    Neuropathy    hands and feet   Obesity    morbid   Other dysfunctions of sleep stages or arousal from sleep    Prostate cancer (HCC)    Right foot pain    Sleep apnea with use of continuous positive airway pressure (CPAP)    diagnosed spells as microsleep atacks, AHI  :105--on 06-16-12 ,    Snoring    Syncope and  collapse     Past Surgical History:  Procedure Laterality Date   APPENDECTOMY  1972   PROSTATE BIOPSY      Current Medications: Current Meds  Medication Sig   amLODipine (NORVASC) 10 MG tablet Take 1 tablet (10 mg total) by mouth daily.   aspirin 81 MG tablet Take 81 mg by mouth daily.   chlorthalidone (HYGROTON) 25 MG tablet Take 1 tablet (25 mg total) by mouth daily.   fluticasone (FLONASE) 50 MCG/ACT nasal spray Place 2 sprays into both nostrils daily.   insulin glargine (LANTUS SOLOSTAR) 100 UNIT/ML Solostar Pen Inject 22 Units into the skin at bedtime. Titrate up by 2 units each week (maximum 80 units/day) until fasting glucose < 150.   insulin glargine (LANTUS) 100 UNIT/ML injection Inject 42 Units into the skin at bedtime. Titrate up by 2 units each week (maximum 80 units/day) until fasting glucose < 150.   insulin lispro (HUMALOG) 100 UNIT/ML injection Inject 0.3 mLs (30 Units total) into the skin 3 (three) times daily before meals.   Insulin Syringe-Needle U-100 (INSULIN SYRINGE .3CC/29GX1/2") 29G X 1/2" 0.3 ML MISC Use as directed three times a day   valsartan (DIOVAN) 320 MG tablet Take 1 tablet (320 mg total) by mouth daily.   [DISCONTINUED] doxazosin (CARDURA) 4 MG tablet Take 1 tablet (4 mg total) by mouth daily.     Allergies:   Codeine and Metformin and related   Social History  Socioeconomic History   Marital status: Married    Spouse name: Aram Beecham   Number of children: 2   Years of education: 14   Highest education level: Not on file  Occupational History   Occupation: disabled  Tobacco Use   Smoking status: Former    Current packs/day: 0.00    Average packs/day: 2.0 packs/day for 20.0 years (40.0 ttl pk-yrs)    Types: Cigarettes    Start date: 06/23/1966    Quit date: 06/23/1986    Years since quitting: 36.3   Smokeless tobacco: Never  Vaping Use   Vaping status: Never Used  Substance and Sexual Activity   Alcohol use: No   Drug use: No   Sexual  activity: Yes  Other Topics Concern   Not on file  Social History Narrative   Not on file   Social Determinants of Health   Financial Resource Strain: Low Risk  (09/08/2022)   Overall Financial Resource Strain (CARDIA)    Difficulty of Paying Living Expenses: Not hard at all  Food Insecurity: No Food Insecurity (09/08/2022)   Hunger Vital Sign    Worried About Running Out of Food in the Last Year: Never true    Ran Out of Food in the Last Year: Never true  Transportation Needs: No Transportation Needs (09/08/2022)   PRAPARE - Administrator, Civil Service (Medical): No    Lack of Transportation (Non-Medical): No  Physical Activity: Inactive (09/08/2022)   Exercise Vital Sign    Days of Exercise per Week: 0 days    Minutes of Exercise per Session: 0 min  Stress: No Stress Concern Present (09/08/2022)   [employer] of Occupational Health - Occupational Stress Questionnaire    Feeling of Stress : Not at all  Social Connections: Unknown (09/08/2022)   Social Connection and Isolation Panel [NHANES]    Frequency of Communication with Friends and Family: Three times a week    Frequency of Social Gatherings with Friends and Family: Never    Attends Religious Services: Patient declined    Database administrator or Organizations: No    Attends Engineer, structural: Never    Marital Status: Married     Family History: The patient's family history includes Dementia in his mother; Diabetes in his maternal aunt; Heart disease in his father and paternal uncle; Neuropathy in his brother. There is no history of Stomach cancer, Prostate cancer, Breast cancer, or Pancreatic cancer.  ROS:   Please see the history of present illness.     All other systems reviewed and are negative.  EKGs/Labs/Other Studies Reviewed:         Recent Labs: 09/18/2022: BUN 21; Creatinine, Ser 1.32; Potassium 4.9; Sodium 137; TSH 1.110   Recent Lipid Panel    Component Value Date/Time    CHOL 219 (H) 05/27/2022 1054   TRIG 321.0 (H) 05/27/2022 1054   HDL 34.60 (L) 05/27/2022 1054   CHOLHDL 6 05/27/2022 1054   VLDL 64.2 (H) 05/27/2022 1054   LDLDIRECT 138.0 05/27/2022 1054    Physical Exam:   VS:  BP (!) 162/70 (BP Location: Right Arm, Patient Position: Sitting, Cuff Size: Large)   Pulse 69   Ht 5\' 8"  (1.727 m)   Wt 261 lb 14.4 oz (118.8 kg)   SpO2 96%   BMI 39.82 kg/m  , BMI Body mass index is 39.82 kg/m.  Vitals:   11/06/22 1022 11/06/22 1048  BP: (!) 161/75 (!) 162/70  Pulse: 69  Height: 5\' 8"  (1.727 m)   Weight: 261 lb 14.4 oz (118.8 kg)   SpO2: 96%   BMI (Calculated): 39.83     GENERAL:  Well appearing, overweight HEENT: Pupils equal round and reactive, fundi not visualized, oral mucosa unremarkable NECK:  No jugular venous distention, waveform within normal limits, carotid upstroke brisk and symmetric, no bruits, no thyromegaly LYMPHATICS:  No cervical adenopathy LUNGS:  Clear to auscultation bilaterally HEART:  RRR.  PMI not displaced or sustained,S1 and S2 within normal limits, no S3, no S4, no clicks, no rubs, no murmurs ABD:  Flat, positive bowel sounds normal in frequency in pitch, no bruits, no rebound, no guarding, no midline pulsatile mass, no hepatomegaly, no splenomegaly EXT:  2 plus pulses throughout, no edema, no cyanosis no clubbing SKIN:  No rashes no nodules NEURO:  Cranial nerves II through XII grossly intact, motor grossly intact throughout PSYCH:  Cognitively intact, oriented to person place and time   ASSESSMENT/PLAN:    HTN - BP is not at goal < 130/80. Prefers to take all oral medications in the morning. Continue amlodipine, chlorthalidone, valsartan. Discussed combination pill amlodipine-valsartan after he runs out of current doses. Increase doxazosin to 8 mg daily. Repeat BMP for monitoring on increased dose Valsartan.  Plan for renal artery duplex, will obtain to rule out RAS. Prefers to work on lifestyle changes. Discussed  Right Start exercise program, not interested at this time. Discussed to monitor BP at home at least 2 hours after medications and sitting for 5-10 minutes. If BP not controlled at follow up and renal function stable could consider Spironolactone.   Carotid stenosis/ HLD - Carotid duplex 07/2020 with 1-39% stenosis bilaterally. Patient declines repeat scan. Previous intolerance to statin. Politely declines additional lipid lowering agents today.   OSA - Previously followed by Dr. Vickey Huger. Encouraged continued CPAP compliance.  DM2 - Continue to follow with PCP. Offered referral to diabetic educator which he declined.   Screening for Secondary Hypertension:     09/09/2022    8:41 PM  Causes  Thyroid Disease Screened     - Comments 08/2022 TSH ordered  Hyperaldosteronism Screened     - Comments 08/2022 renin-aldosterone ordered  Pheochromocytoma Screened     - Comments 06/2021 normal adrenals  Coarctation of the Aorta Screened     - Comments BP symmetric  Compliance Screened    Relevant Labs/Studies:    Latest Ref Rng & Units 09/18/2022    3:34 PM 07/22/2022   10:24 AM 05/27/2022   10:54 AM  Basic Labs  Sodium 134 - 144 mmol/L 137  136  134   Potassium 3.5 - 5.2 mmol/L 4.9  4.2  4.1   Creatinine 0.76 - 1.27 mg/dL 1.61  0.96  0.45        Latest Ref Rng & Units 09/18/2022    3:34 PM 09/24/2012    3:14 PM  Thyroid   TSH 0.450 - 4.500 uIU/mL 1.110  1.130        Latest Ref Rng & Units 09/18/2022    3:34 PM  Renin/Aldosterone   Aldosterone 0.0 - 30.0 ng/dL 6.1   Aldos/Renin Ratio 0.0 - 30.0 1.3              11/06/2022   11:00 AM  Renovascular   Renal Artery [place] Completed Yes     Disposition:    FU with MD/PharmD/APP in 2-3 months.    Medication Adjustments/Labs and Tests Ordered: Current medicines are reviewed at length  with the patient today.  Concerns regarding medicines are outlined above.  Orders Placed This Encounter  Procedures   Basic metabolic panel   VAS US RENAL  ARTERY DUPLEX   Meds ordered this encounter  Medications   doxazosin (CARDURA) 8 MG tablet    Sig: Take 1 tablet (8 mg total) by mouth daily.    Dispense:  90 tablet    Refill:  3     Signed, Alver Sorrow, NP  11/06/2022 12:07 PM    [facility]

## 2022-11-06 NOTE — Patient Instructions (Addendum)
Medication Instructions:  Your physician has recommended you make the following change in your medication:  START DOXAZOSIN 8 MG DAILY  *If you need a refill on your cardiac medications before your next appointment, please call your pharmacy*  Lab Work:  BMP today  If you have labs (blood work) drawn today and your tests are completely normal, you will receive your results only by: MyChart Message (if you have MyChart) OR A paper copy in the mail If you have any lab test that is abnormal or we need to change your treatment, we will call you to review the results.  Testing/Procedures:  Your physician has requested that you have a renal artery duplex. During this test, an ultrasound is used to evaluate blood flow to the kidneys. Allow one hour for this exam. Do not eat after midnight the day before and avoid carbonated beverages. Take your medications as you usually do.  Follow-Up: At [facility], you and your health needs are our priority.  As part of our continuing mission to provide you with exceptional heart care, we have created designated Provider Care Teams.  These Care Teams include your primary Cardiologist (physician) and Advanced Practice Providers (APPs -  Physician Assistants and Nurse Practitioners) who all work together to provide you with the care you need, when you need it.  We recommend signing up for the patient portal called "MyChart".  Sign up information is provided on this After Visit Summary.  MyChart is used to connect with patients for Virtual Visits (Telemedicine).  Patients are able to view lab/test results, encounter notes, upcoming appointments, etc.  Non-urgent messages can be sent to your provider as well.   To learn more about what you can do with MyChart, go to [email or web address].    Follow up: Follow up with Dr. Duke Salvia or Gillian Shields, NP in 2-3 months

## 2022-11-30 IMAGING — RF DG UGI W/ HIGH DENSITY W/O KUB
8 series · 14 of 24 positions shown · non-contrast
Comparison: None Available.

CLINICAL DATA: Postprandial vomiting particularly with cold
liquids.

EXAM:
UPPER GI SERIES WITH KUB
TECHNIQUE: After obtaining a scout radiograph a routine upper GI series was
performed using thin and high density barium.
FLUOROSCOPY:
Radiation Exposure Index (as provided by the fluoroscopic device):
57 mGy Kerma

[Series 1: one shot · 0.14mm/px · 1 of 1 slices shown (1 of 4)]
[im 1/1]
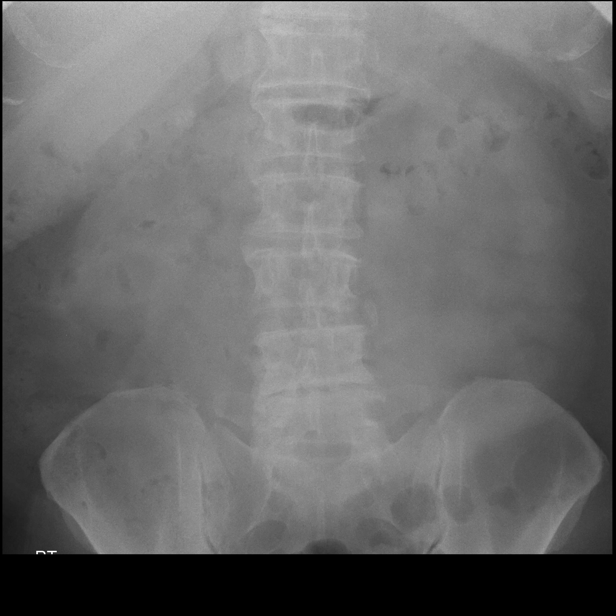

[Series 2: sequence · 1 of 13 frames shown (1 of 4)]
[frame 7/13]
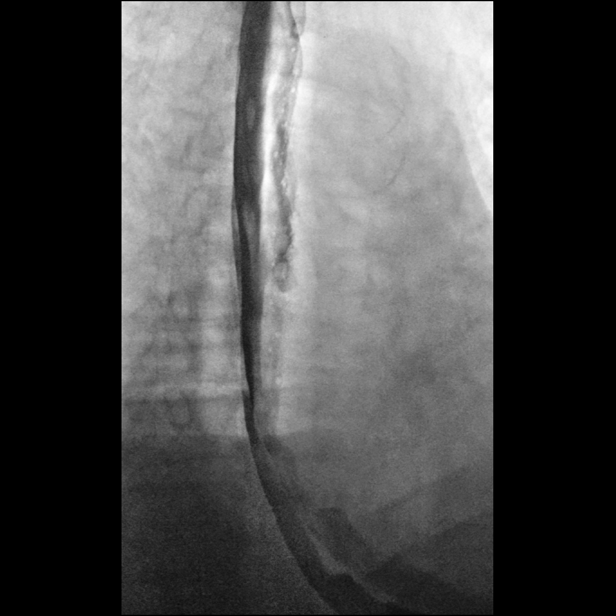

[Series 3: one shot · 0.16mm/px · 4 of 8 slices shown (2 of 4)]
[im 1/8]
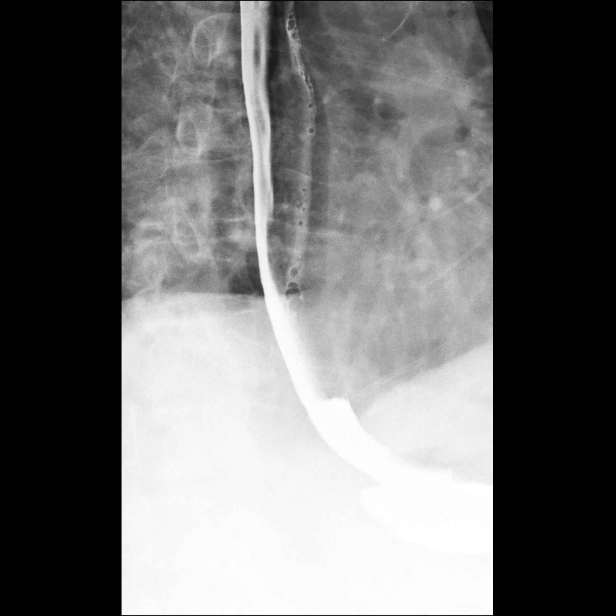
[im 3/8]
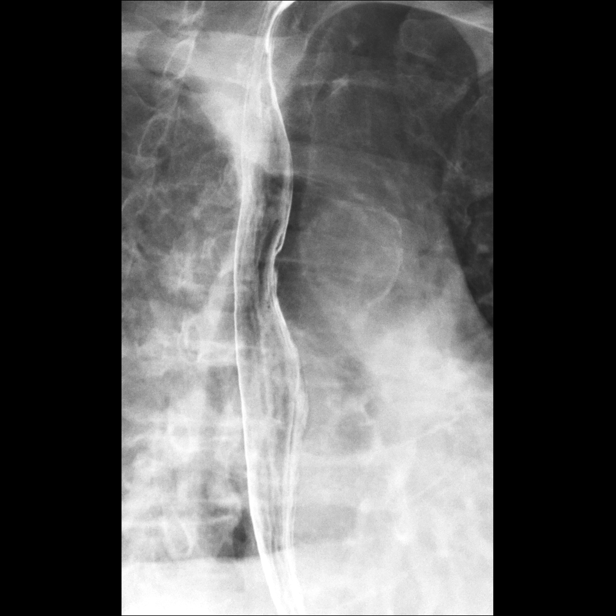
[im 4/8]
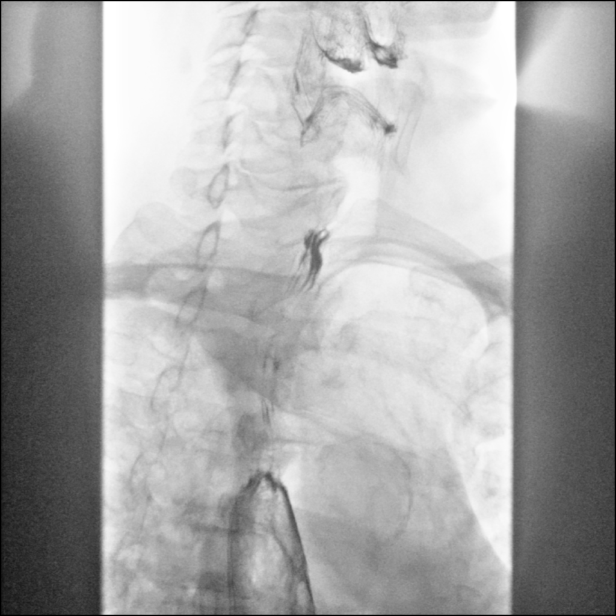
[im 7/8]
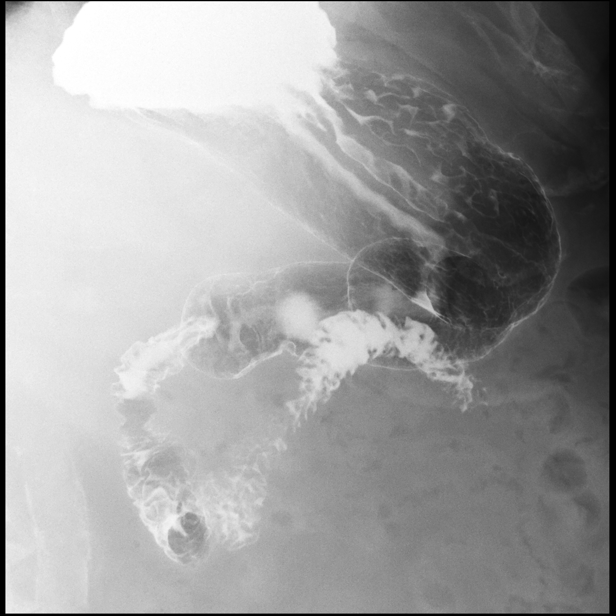

[Series 4: sequence · 3 of 44 frames shown (2 of 4)]
[frame 7/44]
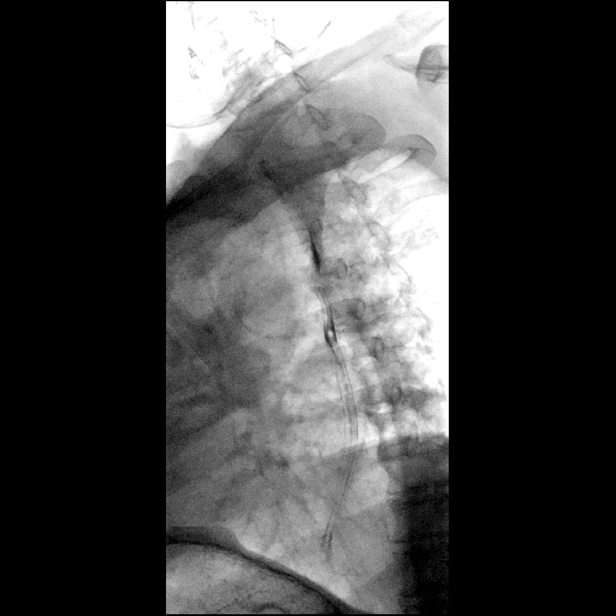
[frame 20/44]
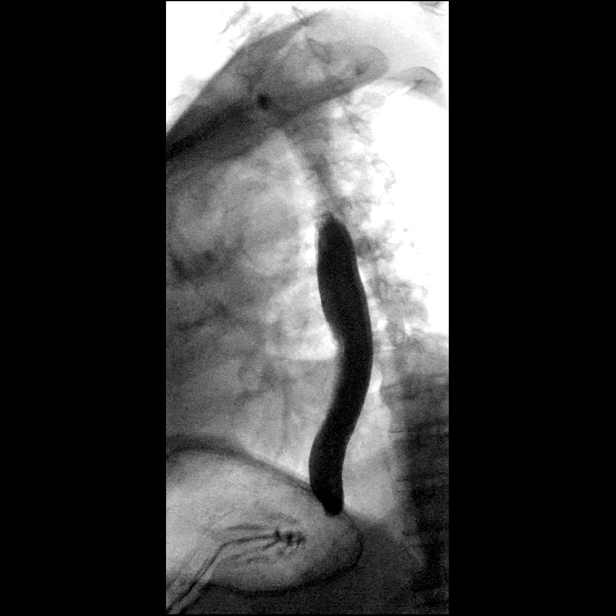
[frame 38/44]
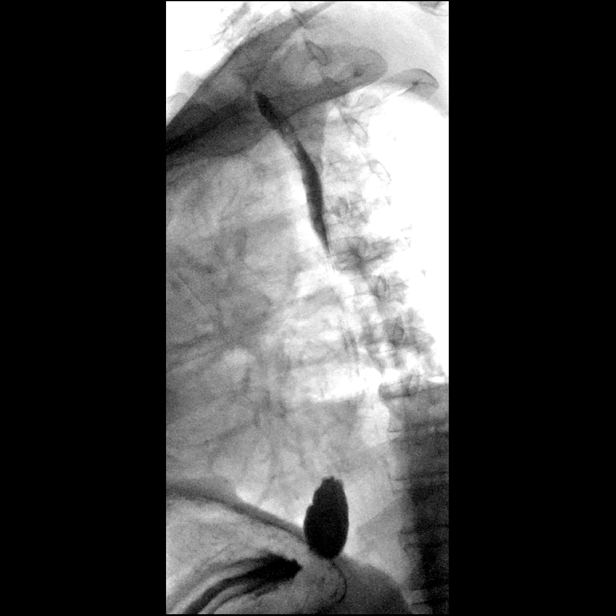

[Series 5: sequence · 1 of 91 frames shown (3 of 4)]
[frame 54/91]
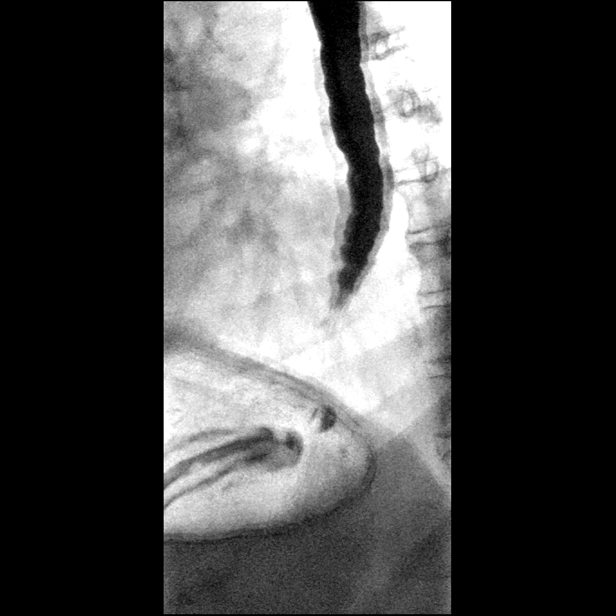

[Series 6: one shot · 0.15mm/px · 2 of 3 slices shown (3 of 4)]
[im 1/3]
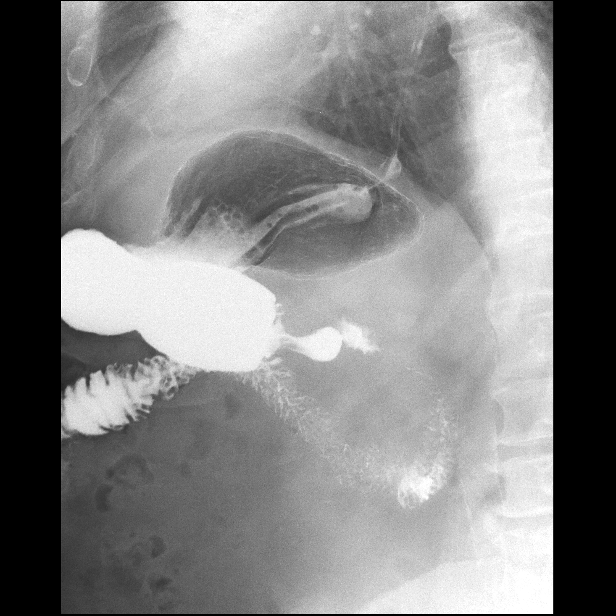
[im 2/3]
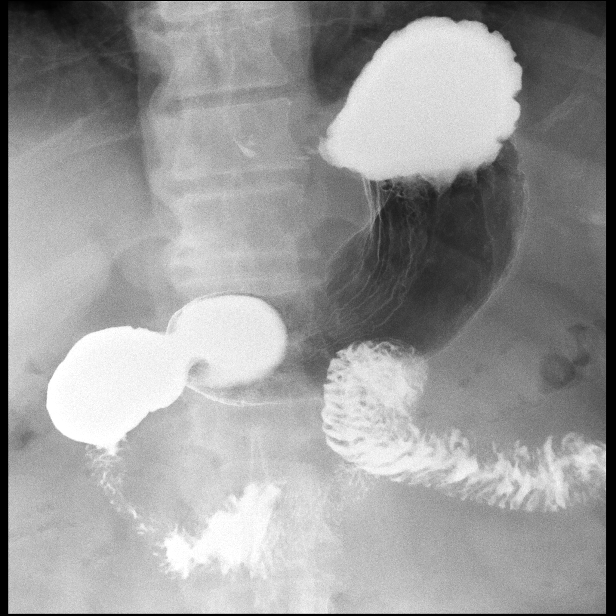

[Series 7: sequence · 1 of 21 frames shown (4 of 4)]
[frame 11/21]
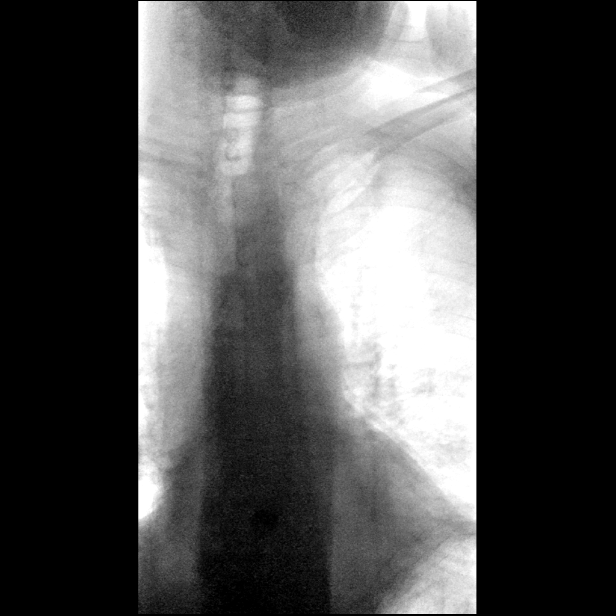

[Series 8: one shot · 1 of 1 slices shown (4 of 4)]
[im 1/1]
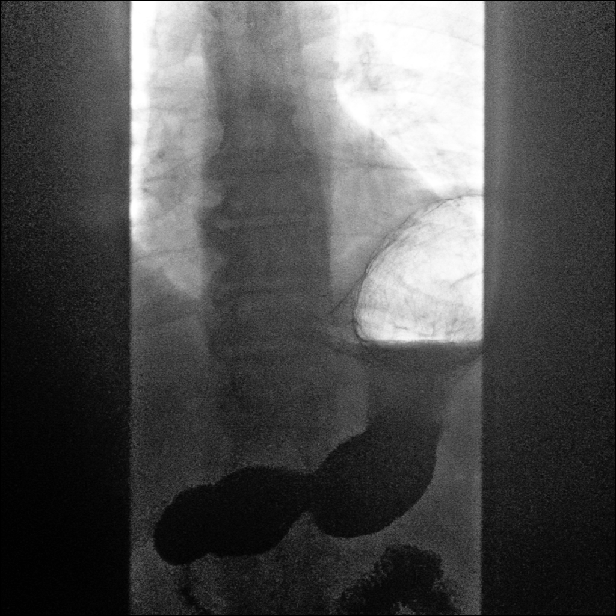

[14 of 24 positions shown; findings below may reference images not displayed]

FINDINGS: Scout radiograph demonstrates no dilated small bowel loops. Mild
colonic stool. No evidence of pneumatosis or pneumoperitoneum. No
radiopaque nephrolithiasis. Moderate lumbar spondylosis.

No hiatal hernia. No gastroesophageal reflux elicited. Mild
esophageal dysmotility with mild tertiary contractions and mild
proximal escape. Normal esophageal mucosa. Normal esophageal
distensibility, with no evidence of esophageal mass, ulcer or
stricture. A swallowed 13 mm barium tablet traversed the esophagus
into the stomach without delay.

No gastric fold thickening, filling defects, ulcers or significant
erosions. Normal gastric emptying. Normal duodenal bulb and C sweep
with no duodenal fold thickening, filling defects, ulcers or
strictures. Normal duodenal jejunal junction to the left of the
spine. Visualized proximal jejunal loops are normal caliber and
without fold thickening.
IMPRESSION: 1. Mild esophageal dysmotility, with a presbyesophagus pattern.
2. Otherwise normal upper GI. No hiatal hernia. No gastroesophageal
reflux elicited. No evidence of mass, peptic ulcer disease or
stricture.

## 2022-12-04 ENCOUNTER — Encounter (HOSPITAL_BASED_OUTPATIENT_CLINIC_OR_DEPARTMENT_OTHER): Payer: Medicare Other

## 2022-12-15 DIAGNOSIS — C61 Malignant neoplasm of prostate: Secondary | ICD-10-CM | POA: Diagnosis not present

## 2022-12-22 DIAGNOSIS — R3912 Poor urinary stream: Secondary | ICD-10-CM | POA: Diagnosis not present

## 2022-12-22 DIAGNOSIS — N5201 Erectile dysfunction due to arterial insufficiency: Secondary | ICD-10-CM | POA: Diagnosis not present

## 2022-12-22 DIAGNOSIS — Z8546 Personal history of malignant neoplasm of prostate: Secondary | ICD-10-CM | POA: Diagnosis not present

## 2023-01-28 ENCOUNTER — Encounter: Payer: Self-pay | Admitting: Family Medicine

## 2023-01-28 ENCOUNTER — Ambulatory Visit: Payer: Medicare Other | Admitting: Family Medicine

## 2023-01-28 VITALS — BP 122/68 | HR 70 | Temp 97.6°F | Ht 68.0 in | Wt 255.0 lb

## 2023-01-28 DIAGNOSIS — Z794 Long term (current) use of insulin: Secondary | ICD-10-CM | POA: Diagnosis not present

## 2023-01-28 DIAGNOSIS — Z6838 Body mass index (BMI) 38.0-38.9, adult: Secondary | ICD-10-CM

## 2023-01-28 DIAGNOSIS — E1129 Type 2 diabetes mellitus with other diabetic kidney complication: Secondary | ICD-10-CM

## 2023-01-28 DIAGNOSIS — T466X5A Adverse effect of antihyperlipidemic and antiarteriosclerotic drugs, initial encounter: Secondary | ICD-10-CM

## 2023-01-28 DIAGNOSIS — I1A0 Resistant hypertension: Secondary | ICD-10-CM

## 2023-01-28 DIAGNOSIS — R809 Proteinuria, unspecified: Secondary | ICD-10-CM | POA: Diagnosis not present

## 2023-01-28 DIAGNOSIS — E66812 Obesity, class 2: Secondary | ICD-10-CM

## 2023-01-28 DIAGNOSIS — M791 Myalgia, unspecified site: Secondary | ICD-10-CM

## 2023-01-28 DIAGNOSIS — E782 Mixed hyperlipidemia: Secondary | ICD-10-CM | POA: Diagnosis not present

## 2023-01-28 LAB — LIPID PANEL
Cholesterol: 196 mg/dL (ref 0–200)
HDL: 27.8 mg/dL — ABNORMAL LOW (ref >39.00)
LDL Cholesterol: 120 mg/dL — ABNORMAL HIGH (ref 0–99)
NonHDL: 168.55
Total CHOL/HDL Ratio: 7
Triglycerides: 244 mg/dL — ABNORMAL HIGH (ref 0.0–149.0)
VLDL: 48.8 mg/dL — ABNORMAL HIGH (ref 0.0–40.0)

## 2023-01-28 LAB — HEMOGLOBIN A1C: Hgb A1c MFr Bld: 8.2 % — ABNORMAL HIGH (ref 4.6–6.5)

## 2023-01-28 LAB — GLUCOSE, RANDOM: Glucose, Bld: 156 mg/dL — ABNORMAL HIGH (ref 70–99)

## 2023-01-28 MED ORDER — LANTUS SOLOSTAR 100 UNIT/ML ~~LOC~~ SOPN
22.0000 [IU] | PEN_INJECTOR | Freq: Every day | SUBCUTANEOUS | 4 refills | Status: AC
Start: 2023-01-28 — End: ?

## 2023-01-28 NOTE — Assessment & Plan Note (Signed)
 Maximum weight: 261 lbs (10/2022) Current weight: 255 lbs Weight change since last visit: - 6 lbs Total weight loss: - 6 lbs (2.3%)  I encouraged Chase [name] regarding his weight loss efforts. I support his focus on reducing carbs. I strongly recommend he be getting regular exercise, suggesting he focus on 20-30 min of walking on days that he is not working.

## 2023-01-28 NOTE — Assessment & Plan Note (Signed)
 Chase [name] has been unable to tolerate statins. He is not ready for addition of further meds.

## 2023-01-28 NOTE — Assessment & Plan Note (Signed)
 Mr. Boers BP is now at goal. Continue amlodipine  10 mg daily, doxazosin  8 mg daily, chlorthalidone  25 mg daily. and valsartan  320 mg daily.

## 2023-01-28 NOTE — Assessment & Plan Note (Signed)
 Blood pressure control is improved. I would consider an SGLT2i for his diabetes when he is ready to make an addition.

## 2023-01-28 NOTE — Assessment & Plan Note (Signed)
 Chase [name] has hyperlipidemia, but has been intolerant of statins. I offered to refer him to the [facility] to consider starting Repatha. He prefers to continue to work at weight loss and do some research on his own on Repatha.

## 2023-01-28 NOTE — Progress Notes (Signed)
 [facility] [address] [place] [address] Dept: [phone] Dept Fax: [phone]  Chronic Care Office Visit  Subjective:    Patient ID: Chase [name], male    DOB: 04-May-1950, 73 y.o..   MRN: [id number]  Chief Complaint  Patient presents with   Hypertension    3 month f/u HTN.  No concerns.  BP today at home 139/62   History of Present Illness:  Patient is in today for reassessment of chronic medical issues.  Chase [name] has a history of Type 2 diabetes. He had been managed on insulin  glargine (Lantus ) 22 units at bedtime and insulin  lispro 40 units with each meal. I have had him titrating up on his Lantus  with a goal to get his fasting glucoses under 150. He notes that he stopped taking his Lantus , as he felt it was not effective. He has instead been taking his insulin  lispro 40 units with each meal and 8-10 units at bedtime if he feels his sugars are too high. He notes his fasting glucose has been around 165. He also notes that he ahs implemented several dietary changes to try and lose weight. He notes that since he started this, he has dropped about 12 lbs of weight. 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  Chase [name] has a history of hyperlipidemia. He had significant muscle cramps/pain associated with using a statin. He has been reluctant to be more aggressive about managing his lipids.   Chase [name] has a history of hypertension. He is managed on amlodipine  10 mg daily, doxazosin  8 mg daily, chlorthalidone  25 mg daily, and valsartan  320 mg daily. [facility].  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   Malignant neoplasm of prostate (HCC) 02/11/2019   Sleep apnea with use of continuous positive airway pressure (CPAP) 04/04/2016   Hypersomnia, persistent 06/03/2012   Class 2 obesity due to excess calories with body mass index (BMI) of 38.0 to 38.9 in adult 06/03/2012   History of colon polyps 06/05/2007   History of urinary stone 06/05/2007   Resistant hypertension 06/05/2007   Hyperlipidemia 06/05/2007   Type 2 diabetes mellitus with renal complication (HCC) 06/05/2007   Past Surgical History:  Procedure Laterality Date   APPENDECTOMY  1972   PROSTATE BIOPSY     Family History  Problem Relation Age of Onset   Dementia Mother    Heart disease Father    Neuropathy Brother    Diabetes Maternal Aunt    Heart disease Paternal Uncle    Stomach cancer Neg Hx    Prostate cancer Neg Hx    Breast cancer Neg Hx    Pancreatic cancer Neg Hx    Outpatient Medications Prior to Visit  Medication Sig Dispense Refill   amLODipine  (NORVASC ) 10 MG tablet Take 1 tablet (10 mg total) by mouth daily. 90 tablet 3   aspirin 81 MG tablet Take 81 mg by mouth daily.     chlorthalidone  (HYGROTON ) 25 MG tablet Take 1 tablet (25 mg total) by mouth daily. 90 tablet 3   doxazosin  (CARDURA ) 8 MG tablet Take 1 tablet (8  mg total) by mouth daily. 90 tablet 3   fluticasone (FLONASE) 50 MCG/ACT nasal spray Place 2 sprays into both nostrils daily.     insulin  glargine (LANTUS  SOLOSTAR) 100 UNIT/ML Solostar Pen Inject 22 Units into the skin at bedtime. Titrate up by 2 units each week (maximum 80 units/day) until fasting glucose < 150. 15 mL PRN   insulin  glargine (LANTUS ) 100 UNIT/ML injection Inject 42 Units into the skin at bedtime. Titrate up by 2 units each week (maximum 80 units/day) until fasting glucose < 150. 10 mL 11   insulin  lispro (HUMALOG ) 100 UNIT/ML injection Inject 0.3 mLs (30 Units total) into the skin 3 (three) times daily before meals. 10 mL 11   Insulin   Syringe-Needle U-100 (INSULIN  SYRINGE .3CC/29GX1/2") 29G X 1/2" 0.3 ML MISC Use as directed three times a day 100 each 11   valsartan  (DIOVAN ) 320 MG tablet Take 1 tablet (320 mg total) by mouth daily. 90 tablet 3   No facility-administered medications prior to visit.   Allergies  Allergen Reactions   Codeine Nausea And Vomiting   Metformin  And Related Diarrhea   Objective:   Today's Vitals   01/28/23 1002  BP: 122/68  Pulse: 70  Temp: 97.6 F (36.4 C)  TempSrc: Temporal  SpO2: 97%  Weight: 255 lb (115.7 kg)  Height: 5\' 8"  (1.727 m)   Body mass index is 38.77 kg/m.   General: Well developed, well nourished. No acute distress. Psych: Alert and oriented. Normal mood and affect.  Health Maintenance Due  Topic Date Due   Pneumonia Vaccine 31+ Years old (1 of 2 - PCV) Never done   Zoster Vaccines- Shingrix (1 of 2) Never done   DTaP/Tdap/Td (2 - Tdap) 03/28/2018     Assessment & Plan:   Problem List Items Addressed This Visit       Cardiovascular and Mediastinum   Resistant hypertension - Primary   Chase [name] BP is now at goal. Continue amlodipine  10 mg daily, doxazosin  8 mg daily, chlorthalidone  25 mg daily. and valsartan  320 mg daily.        Endocrine   Microalbuminuria due to type 2 diabetes mellitus (HCC)   Blood pressure control is improved. I would consider an SGLT2i for his diabetes when he is ready to make an addition.      Relevant Medications   insulin  glargine (LANTUS  SOLOSTAR) 100 UNIT/ML Solostar Pen   Type 2 diabetes mellitus with renal complication (HCC)   Blood sugars remain uncontrolled. I spent time discussing different insulin  types and their role in his diabetes management. I asked him to restart taking insulin  glargine (Lantus ). I will start him back at 22 units each day. This is lower than he was taking previously. However, he describes some recent episodes of hypoglycemia. I reviewed with him about management of hypoglycemia. He asks about  being given 3 more months to work at his weight loss before making further changes in his diabetes meds.      Relevant Medications   insulin  glargine (LANTUS  SOLOSTAR) 100 UNIT/ML Solostar Pen   Other Relevant Orders   Glucose, random   Hemoglobin A1c     Other   Class 2 obesity due to excess calories with body mass index (BMI) of 38.0 to 38.9 in adult   Maximum weight: 261 lbs (10/2022) Current weight: 255 lbs Weight change since last visit: - 6 lbs Total weight loss: - 6 lbs (2.3%)  I encouraged Mr. Malla regarding his weight loss efforts. I  support his focus on reducing carbs. I strongly recommend he be getting regular exercise, suggesting he focus on 20-30 min of walking on days that he is not working.      Relevant Medications   insulin  glargine (LANTUS  SOLOSTAR) 100 UNIT/ML Solostar Pen   Hyperlipidemia   Mr. Roerig has hyperlipidemia, but has been intolerant of statins. I offered to refer him to the [facility] to consider starting Repatha. He prefers to continue to work at weight loss and do some research on his own on Repatha.       Relevant Orders   Lipid panel   Myalgia due to statin   Mr. Blakeman has been unable to tolerate statins. He is not ready for addition of further meds.       Return in about 3 months (around 04/28/2023) for Reassessment.   Graig Lawyer, MD

## 2023-01-28 NOTE — Assessment & Plan Note (Addendum)
 Blood sugars remain uncontrolled. I spent time discussing different insulin  types and their role in his diabetes management. I asked him to restart taking insulin  glargine (Lantus ). I will start him back at 22 units each day. This is lower than he was taking previously. However, he describes some recent episodes of hypoglycemia. I reviewed with him about management of hypoglycemia. He asks about being given 3 more months to work at his weight loss before making further changes in his diabetes meds.

## 2023-04-07 ENCOUNTER — Encounter (HOSPITAL_BASED_OUTPATIENT_CLINIC_OR_DEPARTMENT_OTHER): Payer: Self-pay | Admitting: Family

## 2023-04-28 ENCOUNTER — Encounter: Payer: Self-pay | Admitting: Family Medicine

## 2023-04-28 ENCOUNTER — Ambulatory Visit (INDEPENDENT_AMBULATORY_CARE_PROVIDER_SITE_OTHER): Payer: Medicare Other | Admitting: Family Medicine

## 2023-04-28 VITALS — BP 138/72 | HR 70 | Temp 98.1°F | Ht 68.0 in | Wt 259.6 lb

## 2023-04-28 DIAGNOSIS — Z6838 Body mass index (BMI) 38.0-38.9, adult: Secondary | ICD-10-CM

## 2023-04-28 DIAGNOSIS — Z794 Long term (current) use of insulin: Secondary | ICD-10-CM

## 2023-04-28 DIAGNOSIS — R809 Proteinuria, unspecified: Secondary | ICD-10-CM | POA: Diagnosis not present

## 2023-04-28 DIAGNOSIS — I1A0 Resistant hypertension: Secondary | ICD-10-CM

## 2023-04-28 DIAGNOSIS — E66812 Obesity, class 2: Secondary | ICD-10-CM | POA: Diagnosis not present

## 2023-04-28 DIAGNOSIS — E1129 Type 2 diabetes mellitus with other diabetic kidney complication: Secondary | ICD-10-CM

## 2023-04-28 LAB — HEMOGLOBIN A1C: Hgb A1c MFr Bld: 8.1 % — ABNORMAL HIGH (ref 4.6–6.5)

## 2023-04-28 LAB — GLUCOSE, RANDOM: Glucose, Bld: 227 mg/dL — ABNORMAL HIGH (ref 70–99)

## 2023-04-28 MED ORDER — TIRZEPATIDE 2.5 MG/0.5ML ~~LOC~~ SOAJ: 2.5000 mg | SUBCUTANEOUS | 0 refills | Status: DC

## 2023-04-28 MED ORDER — OZEMPIC (0.25 OR 0.5 MG/DOSE) 2 MG/3ML ~~LOC~~ SOPN: 0.2500 mg | PEN_INJECTOR | SUBCUTANEOUS | 1 refills | Status: DC

## 2023-04-28 MED ORDER — TIRZEPATIDE 5 MG/0.5ML ~~LOC~~ SOAJ: 5.0000 mg | SUBCUTANEOUS | 2 refills | Status: DC

## 2023-04-28 NOTE — Assessment & Plan Note (Signed)
 Maximum weight: 261 lbs (10/2022) Current weight: 259 lbs Weight change since last visit: + 4 lbs Total weight loss: - 2 lbs (0.7%)  I encouraged Chase [name] regarding his weight loss efforts. I support his focus on reducing carbs and decreasing intake of red meats. I strongly recommend he be getting regular exercise, suggesting he focus on 20-30 min of walking on days that he is not working. We will see if we can initiate an SGLT2i.

## 2023-04-28 NOTE — Progress Notes (Signed)
 [facility] [address] [place] [address] Dept: [phone] Dept Fax: [phone]  Chronic Care Office Visit  Subjective:    Patient ID: Chase [name], male    DOB: [birth year], 73 y.o..   MRN: [id number]  Chief Complaint  Patient presents with   Hypertension    3 month f/u HTN.     History of Present Illness:  Patient is in today for reassessment of chronic medical issues.  Chase [name] has a history of Type 2 diabetes. He had been managed on insulin glargine (Lantus) 22 units at bedtime and insulin lispro 40 units with each meal.  He notes his blood sugars have been in the mid-300s more recently. He had planned to work at weight loss, but has found he is unable to achieve this.    Chase [name] has a history of hyperlipidemia. He had significant muscle cramps/pain associated with using a statin. He has been reluctant to be more aggressive about managing his lipids, preferring to work at lifestyle issues.   Chase [name] has a history of hypertension. He is managed on amlodipine 10 mg daily, doxazosin 8 mg daily, chlorthalidone 25 mg daily, and valsartan 320 mg daily. He had been [facility] with [facility].  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   Malignant neoplasm of prostate (HCC) 02/11/2019   Sleep apnea with use of continuous positive airway pressure (CPAP) 04/04/2016   Hypersomnia, persistent 06/03/2012   Class 2 obesity due to excess calories with body mass index (BMI) of 38.0 to 38.9 in adult 06/03/2012   History of colon polyps 06/05/2007   History of urinary stone 06/05/2007   Resistant hypertension 06/05/2007   Hyperlipidemia 06/05/2007   Type 2 diabetes  mellitus with renal complication (HCC) 06/05/2007   Past Surgical History:  Procedure Laterality Date   APPENDECTOMY  1972   PROSTATE BIOPSY     Family History  Problem Relation Age of Onset   Dementia Mother    Heart disease Father    Neuropathy Brother    Diabetes Maternal Aunt    Heart disease Paternal Uncle    Stomach cancer Neg Hx    Prostate cancer Neg Hx    Breast cancer Neg Hx    Pancreatic cancer Neg Hx    Outpatient Medications Prior to Visit  Medication Sig Dispense Refill   amLODipine (NORVASC) 10 MG tablet Take 1 tablet (10 mg total) by mouth daily. 90 tablet 3   aspirin 81 MG tablet Take 81 mg by mouth daily.     chlorthalidone (HYGROTON) 25 MG tablet Take 1 tablet (25 mg total) by mouth daily. 90 tablet 3   doxazosin (CARDURA) 8 MG tablet Take 1 tablet (8 mg total) by mouth daily. 90 tablet 3   fluticasone (FLONASE) 50 MCG/ACT nasal spray Place 2 sprays into both nostrils daily.     insulin glargine (LANTUS SOLOSTAR) 100 UNIT/ML Solostar Pen Inject 22 Units into the skin at bedtime. 15 mL 4   insulin lispro (HUMALOG) 100 UNIT/ML injection Inject 0.3 mLs (30 Units total) into the skin 3 (three) times daily before meals. 10 mL 11   Insulin Syringe-Needle U-100 (INSULIN SYRINGE .3CC/29GX1/2") 29G X 1/2" 0.3 ML MISC Use as directed three  times a day 100 each 11   valsartan (DIOVAN) 320 MG tablet Take 1 tablet (320 mg total) by mouth daily. 90 tablet 3   No facility-administered medications prior to visit.   Allergies  Allergen Reactions   Codeine Nausea And Vomiting   Metformin And Related Diarrhea   Objective:   Today's Vitals   04/28/23 1027  BP: 138/72  Pulse: 70  Temp: 98.1 F (36.7 C)  TempSrc: Temporal  SpO2: 97%  Weight: 259 lb 9.6 oz (117.8 kg)  Height: 5\' 8"  (1.727 m)   Body mass index is 39.47 kg/m.   General: Well developed, well nourished. No acute distress. Psych: Alert and oriented. Normal mood and affect.  Health Maintenance Due   Topic Date Due   Zoster Vaccines- Shingrix (1 of 2) Never done   DTaP/Tdap/Td (2 - Tdap) 03/28/2018   Diabetic kidney evaluation - Urine ACR  05/27/2023     Assessment & Plan:   Problem List Items Addressed This Visit       Cardiovascular and Mediastinum   Resistant hypertension - Primary   Chase [name] BP is in adequate control and much improved. Continue amlodipine 10 mg daily, doxazosin 8 mg daily, chlorthalidone 25 mg daily and valsartan 320 mg daily.        Endocrine   Type 2 diabetes mellitus with renal complication (HCC)   Blood sugars remain uncontrolled. Continue insulin glargine (Lantus) 22 units at bedtime and insulin lispro 40 units with each meal. We had previously discussed about other therapies for his diabetes. I will try prescribing tirzepatide (Mounjaro). This could help improve his blood sugars, but also effect some weight loss, which would be desirable.      Relevant Medications   tirzepatide (MOUNJARO) 2.5 MG/0.5ML Pen   tirzepatide (MOUNJARO) 5 MG/0.5ML Pen   Other Relevant Orders   Glucose, random   Hemoglobin A1c     Other   Class 2 obesity due to excess calories with body mass index (BMI) of 38.0 to 38.9 in adult   Maximum weight: 261 lbs (10/2022) Current weight: 259 lbs Weight change since last visit: + 4 lbs Total weight loss: - 2 lbs (0.7%)  I encouraged Chase [name] regarding his weight loss efforts. I support his focus on reducing carbs and decreasing intake of red meats. I strongly recommend he be getting regular exercise, suggesting he focus on 20-30 min of walking on days that he is not working. We will see if we can initiate an SGLT2i.      Relevant Medications   tirzepatide (MOUNJARO) 2.5 MG/0.5ML Pen   tirzepatide (MOUNJARO) 5 MG/0.5ML Pen    Return in about 2 months (around 06/28/2023) for Reassessment.   Chase Lawyer, MD

## 2023-04-28 NOTE — Assessment & Plan Note (Signed)
 Chase [name] BP is in adequate control and much improved. Continue amlodipine 10 mg daily, doxazosin 8 mg daily, chlorthalidone 25 mg daily and valsartan 320 mg daily.

## 2023-04-28 NOTE — Assessment & Plan Note (Signed)
 Blood sugars remain uncontrolled. Continue insulin glargine (Lantus) 22 units at bedtime and insulin lispro 40 units with each meal. We had previously discussed about other therapies for his diabetes. I will try prescribing tirzepatide (Mounjaro). This could help improve his blood sugars, but also effect some weight loss, which would be desirable.

## 2023-05-28 ENCOUNTER — Telehealth: Payer: Self-pay

## 2023-05-28 NOTE — Telephone Encounter (Signed)
 Copied from CRM [id number]. Topic: General - Other >> May 27, 2023  2:52 PM Magdalene School wrote: Reason for CRM: Mylinda Asa calling from [facility]. She would like a call back to verify patient's statin history. Call back number: [phone] extention [phone]

## 2023-05-28 NOTE — Telephone Encounter (Signed)
 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 Mylinda Asa wants a message routed to PCP asking if he is aware of the dx and woul dhe consider prescribing a statin for cardiovascular protection. Mylinda Asa states they will call back to follow up after the pt upcoming PCP appt on 06/29/23.

## 2023-06-02 NOTE — Telephone Encounter (Signed)
 Please review message regarding statin history and advise.  Thanks. Dm/cma

## 2023-06-03 NOTE — Telephone Encounter (Signed)
 Called [place] with [employer], advised of message.  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  Dm/cma

## 2023-06-15 ENCOUNTER — Telehealth: Payer: Self-pay

## 2023-06-15 DIAGNOSIS — Z794 Long term (current) use of insulin: Secondary | ICD-10-CM

## 2023-06-15 MED ORDER — OZEMPIC (0.25 OR 0.5 MG/DOSE) 2 MG/3ML ~~LOC~~ SOPN: PEN_INJECTOR | SUBCUTANEOUS | 5 refills | Status: DC

## 2023-06-15 NOTE — Telephone Encounter (Signed)
 Copied from CRM [id number]. Topic: Clinical - Medical Advice >> Jun 15, 2023  9:20 AM Albertha Alosa wrote: Reason for CRM: Patient wife called in stated the medication was to expensive so they did not get the medication, has canceled the appointment for the medication followup since they didn't get it, wanted to know if he still needs to schedule an appointment to see Dr.Rudd

## 2023-06-15 NOTE — Telephone Encounter (Signed)
 Patient notified VIA phone and will let us  know it its still high cost.  Dm/cma

## 2023-06-15 NOTE — Telephone Encounter (Signed)
 Called patient to confirm message.  He didn't pick up the Mounjaro due to the cost was going to be $400 after the co-pay.  Called the pharmacy and was advised that is correct and insurance is covering some of it.    Please review and advise.  Thanks. Dm/cma

## 2023-06-16 DIAGNOSIS — H52209 Unspecified astigmatism, unspecified eye: Secondary | ICD-10-CM | POA: Diagnosis not present

## 2023-06-16 LAB — HM DIABETES EYE EXAM

## 2023-06-17 ENCOUNTER — Other Ambulatory Visit: Payer: Self-pay | Admitting: Family Medicine

## 2023-06-17 ENCOUNTER — Encounter: Payer: Self-pay | Admitting: Family Medicine

## 2023-06-17 DIAGNOSIS — H269 Unspecified cataract: Secondary | ICD-10-CM | POA: Insufficient documentation

## 2023-06-17 DIAGNOSIS — I1A0 Resistant hypertension: Secondary | ICD-10-CM

## 2023-06-20 ENCOUNTER — Other Ambulatory Visit: Payer: Self-pay | Admitting: Family Medicine

## 2023-06-20 DIAGNOSIS — I1A0 Resistant hypertension: Secondary | ICD-10-CM

## 2023-06-29 ENCOUNTER — Ambulatory Visit: Admitting: Family Medicine

## 2023-08-17 ENCOUNTER — Ambulatory Visit (INDEPENDENT_AMBULATORY_CARE_PROVIDER_SITE_OTHER)

## 2023-08-17 DIAGNOSIS — Z0000 Encounter for general adult medical examination without abnormal findings: Secondary | ICD-10-CM

## 2023-08-17 NOTE — Progress Notes (Signed)
 Subjective:   Chase [name] is a 73 y.o. who presents for a Medicare Wellness preventive visit.  As a reminder, Annual Wellness Visits don't include a physical exam, and some assessments may be limited, especially if this visit is performed virtually. We may recommend an in-person follow-up visit with your provider if needed.  Visit Complete: Virtual I connected with  Sim Minder on 08/17/23 by a audio enabled telemedicine application and verified that I am speaking with the correct person using two identifiers.  Patient Location: Home  Provider Location: Office/Clinic  I discussed the limitations of evaluation and management by telemedicine. The patient expressed understanding and agreed to proceed.  Vital Signs: Because this visit was a virtual/telehealth visit, some criteria may be missing or patient reported. Any vitals not documented were not able to be obtained and vitals that have been documented are patient reported.  VideoError- Librarian, academic were attempted between this provider and patient, however failed, due to patient having technical difficulties OR patient did not have access to video capability.  We continued and completed visit with audio only.   Persons Participating in Visit: Patient.  AWV Questionnaire: No: Patient Medicare AWV questionnaire was not completed prior to this visit.  Cardiac Risk Factors include: advanced age (>42men, >12 women);diabetes mellitus;dyslipidemia;male gender;hypertension     Objective:    Today's Vitals   There is no height or weight on file to calculate BMI.     08/17/2023    2:20 PM 09/08/2022    1:59 PM 02/11/2019   10:31 AM  Advanced Directives  Does Patient Have a Medical Advance Directive? No  No  Would patient like information on creating a medical advance directive? No - Patient declined No - Patient declined Yes (MAU/Ambulatory/Procedural Areas - Information given)    Current Medications  (verified) Outpatient Encounter Medications as of 08/17/2023  Medication Sig   amLODipine  (NORVASC ) 10 MG tablet Take 1 tablet (10 mg total) by mouth daily.   aspirin 81 MG tablet Take 81 mg by mouth daily.   chlorthalidone  (HYGROTON ) 25 MG tablet Take 1 tablet by mouth once daily   doxazosin  (CARDURA ) 8 MG tablet Take 1 tablet (8 mg total) by mouth daily.   insulin  glargine (LANTUS  SOLOSTAR) 100 UNIT/ML Solostar Pen Inject 22 Units into the skin at bedtime.   insulin  regular (NOVOLIN R) 100 units/mL injection Inject into the skin 3 (three) times daily before meals.   Insulin  Syringe-Needle U-100 (INSULIN  SYRINGE .3CC/29GX1/2) 29G X 1/2 0.3 ML MISC Use as directed three times a day   Semaglutide ,0.25 or 0.5MG /DOS, (OZEMPIC , 0.25 OR 0.5 MG/DOSE,) 2 MG/3ML SOPN Inject 0.25 mg into the skin once a week for 28 days, THEN 0.5 mg once a week   valsartan  (DIOVAN ) 320 MG tablet Take 1 tablet (320 mg total) by mouth daily.   fluticasone (FLONASE) 50 MCG/ACT nasal spray Place 2 sprays into both nostrils daily. (Patient not taking: Reported on 08/17/2023)   insulin  lispro (HUMALOG ) 100 UNIT/ML injection Inject 0.3 mLs (30 Units total) into the skin 3 (three) times daily before meals. (Patient not taking: Reported on 08/17/2023)   No facility-administered encounter medications on file as of 08/17/2023.    Allergies (verified) Codeine and Metformin  and related   History: Past Medical History:  Diagnosis Date   Diabetes mellitus (HCC)    Hyperlipidemia    Hypersomnia    Hypertension    Neuropathy    hands and feet   Obesity    morbid  Other dysfunctions of sleep stages or arousal from sleep    Prostate cancer (HCC)    Right foot pain    Sleep apnea with use of continuous positive airway pressure (CPAP)    diagnosed spells as microsleep atacks, AHI  :105--on 06-16-12 ,    Snoring    Syncope and collapse    Past Surgical History:  Procedure Laterality Date   APPENDECTOMY  1972   PROSTATE BIOPSY      Family History  Problem Relation Age of Onset   Dementia Mother    Heart disease Father    Neuropathy Brother    Diabetes Maternal Aunt    Heart disease Paternal Uncle    Stomach cancer Neg Hx    Prostate cancer Neg Hx    Breast cancer Neg Hx    Pancreatic cancer Neg Hx    Social History   Socioeconomic History   Marital status: Married    Spouse name: Montie   Number of children: 2   Years of education: 14   Highest education level: Not on file  Occupational History   Occupation: disabled  Tobacco Use   Smoking status: Former    Current packs/day: 0.00    Average packs/day: 2.0 packs/day for 20.0 years (40.0 ttl pk-yrs)    Types: Cigarettes    Start date: 06/23/1966    Quit date: 06/23/1986    Years since quitting: 37.1   Smokeless tobacco: Never  Vaping Use   Vaping status: Never Used  Substance and Sexual Activity   Alcohol  use: No   Drug use: No   Sexual activity: Yes  Other Topics Concern   Not on file  Social History Narrative   Not on file   Social Drivers of Health   Financial Resource Strain: Low Risk  (08/17/2023)   Overall Financial Resource Strain (CARDIA)    Difficulty of Paying Living Expenses: Not hard at all  Food Insecurity: No Food Insecurity (08/17/2023)   Hunger Vital Sign    Worried About Running Out of Food in the Last Year: Never true    Ran Out of Food in the Last Year: Never true  Transportation Needs: No Transportation Needs (08/17/2023)   PRAPARE - Administrator, Civil Service (Medical): No    Lack of Transportation (Non-Medical): No  Physical Activity: Inactive (08/17/2023)   Exercise Vital Sign    Days of Exercise per Week: 0 days    Minutes of Exercise per Session: 0 min  Stress: No Stress Concern Present (08/17/2023)   [employer] of Occupational Health - Occupational Stress Questionnaire    Feeling of Stress: Not at all  Social Connections: Unknown (08/17/2023)   Social Connection and Isolation Panel    Frequency  of Communication with Friends and Family: Twice a week    Frequency of Social Gatherings with Friends and Family: Not on file    Attends Religious Services: More than 4 times per year    Active Member of [employer] or Organizations: No    Attends Banker Meetings: Never    Marital Status: Married    Tobacco Counseling Counseling given: Not Answered    Clinical Intake:  Pre-visit preparation completed: Yes  Pain : No/denies pain     Nutritional Risks: None Diabetes: Yes CBG done?: No Did pt. bring in CBG monitor from home?: No  Lab Results  Component Value Date   HGBA1C 8.1 (H) 04/28/2023   HGBA1C 8.2 (H) 01/28/2023   HGBA1C 8.8 (H)  10/28/2022     How often do you need to have someone help you when you read instructions, pamphlets, or other written materials from your doctor or pharmacy?: 1 - Never  Interpreter Needed?: No  Information entered by :: NAllen LPN   Activities of Daily Living     08/17/2023    2:17 PM 09/08/2022    1:56 PM  In your present state of health, do you have any difficulty performing the following activities:  Hearing? 0 0  Vision? 0 0  Difficulty concentrating or making decisions? 0 0  Walking or climbing stairs? 0 0  Dressing or bathing? 0 0  Doing errands, shopping? 0 0  Preparing Food and eating ? N N  Using the Toilet? N N  In the past six months, have you accidently leaked urine? N N  Do you have problems with loss of bowel control? N N  Managing your Medications? N N  Managing your Finances? N N  Housekeeping or managing your Housekeeping? N N    Patient Care Team: Thedora Garnette HERO, MD as PCP - General (Family Medicine) Raford Riggs, MD as PCP - Cardiology (Cardiology) Matilda Garnette, MD as Consulting Physician (Urology) Patrcia Cough, MD as Consulting Physician (Radiation Oncology)  I have updated your Care Teams any recent Medical Services you may have received from other providers in the past year.      Assessment:   This is a routine wellness examination for Chase [name].  Hearing/Vision screen Hearing Screening - Comments:: Denies hearing issues Vision Screening - Comments:: Regular eye exams   Goals Addressed             This Visit's Progress    Patient Stated       08/17/2023 denies goals       Depression Screen     08/17/2023    2:21 PM 09/08/2022    2:00 PM 05/27/2022    9:54 AM  PHQ 2/9 Scores  PHQ - 2 Score 0 0 0  PHQ- 9 Score 0      Fall Risk     08/17/2023    2:21 PM 09/08/2022    2:00 PM 05/27/2022    9:54 AM  Fall Risk   Falls in the past year? 0 0 0  Number falls in past yr: 0 0 0  Injury with Fall? 0 0 0  Risk for fall due to : Medication side effect Medication side effect No Fall Risks  Follow up Falls evaluation completed;Falls prevention discussed Falls prevention discussed;Falls evaluation completed Falls evaluation completed    MEDICARE RISK AT HOME:  Medicare Risk at Home Any stairs in or around the home?: Yes If so, are there any without handrails?: No Home free of loose throw rugs in walkways, pet beds, electrical cords, etc?: Yes Adequate lighting in your home to reduce risk of falls?: Yes Life alert?: No Use of a cane, walker or w/c?: No Grab bars in the bathroom?: No Shower chair or bench in shower?: No Elevated toilet seat or a handicapped toilet?: No  TIMED UP AND GO:  Was the test performed?  No  Cognitive Function: 6CIT completed        08/17/2023    2:21 PM 09/08/2022    2:00 PM  6CIT Screen  What Year? 0 points 0 points  What month? 0 points 0 points  What time? 0 points 0 points  Count back from 20 0 points 0 points  Months in reverse 0 points 0  points  Repeat phrase 0 points 0 points  Total Score 0 points 0 points    Immunizations Immunization History  Administered Date(s) Administered   Influenza-Unspecified 11/13/2012   Moderna Covid-19 Vaccine Bivalent Booster 73yrs & up 10/10/2020   Moderna Sars-Covid-2 Vaccination  03/11/2019, 04/08/2019   PNEUMOCOCCAL CONJUGATE-20 11/10/2022   Td 03/27/2008   Tdap 06/02/2023    Screening Tests Health Maintenance  Topic Date Due   Diabetic kidney evaluation - Urine ACR  Never done   Zoster Vaccines- Shingrix (1 of 2) Never done   COVID-19 Vaccine (4 - 2024-25 season) 09/14/2022   FOOT EXAM  05/27/2023   INFLUENZA VACCINE  08/14/2023   Colonoscopy  08/19/2023 (Originally 07/06/2016)   HEMOGLOBIN A1C  10/28/2023   Diabetic kidney evaluation - eGFR measurement  11/06/2023   OPHTHALMOLOGY EXAM  06/15/2024   Medicare Annual Wellness (AWV)  08/16/2024   DTaP/Tdap/Td (3 - Td or Tdap) 06/01/2033   Pneumococcal Vaccine: 50+ Years  Completed   Hepatitis C Screening  Completed   Hepatitis B Vaccines  Aged Out   HPV VACCINES  Aged Out   Meningococcal B Vaccine  Aged Out    Health Maintenance  Health Maintenance Due  Topic Date Due   Diabetic kidney evaluation - Urine ACR  Never done   Zoster Vaccines- Shingrix (1 of 2) Never done   COVID-19 Vaccine (4 - 2024-25 season) 09/14/2022   FOOT EXAM  05/27/2023   INFLUENZA VACCINE  08/14/2023   Health Maintenance Items Addressed: Declines shingles and covid vaccine.  Additional Screening:  Vision Screening: Recommended annual ophthalmology exams for early detection of glaucoma and other disorders of the eye. Would you like a referral to an eye doctor? No    Dental Screening: Recommended annual dental exams for proper oral hygiene  Community Resource Referral / Chronic Care Management: CRR required this visit?  No   CCM required this visit?  No   Plan:    I have personally reviewed and noted the following in the patient's chart:   Medical and social history Use of alcohol , tobacco or illicit drugs  Current medications and supplements including opioid prescriptions. Patient is not currently taking opioid prescriptions. Functional ability and status Nutritional status Physical activity Advanced  directives List of other physicians Hospitalizations, surgeries, and ER visits in previous 12 months Vitals Screenings to include cognitive, depression, and falls Referrals and appointments  In addition, I have reviewed and discussed with patient certain preventive protocols, quality metrics, and best practice recommendations. A written personalized care plan for preventive services as well as general preventive health recommendations were provided to patient.   Ardella FORBES Dawn, LPN   01/18/7972   After Visit Summary: (MyChart) Due to this being a telephonic visit, the after visit summary with patients personalized plan was offered to patient via MyChart   Notes: Nothing significant to report at this time.

## 2023-08-17 NOTE — Patient Instructions (Signed)
 Mr. Payne , Thank you for taking time out of your busy schedule to complete your Annual Wellness Visit with me. I enjoyed our conversation and look forward to speaking with you again next year. I, as well as your care team,  appreciate your ongoing commitment to your health goals. Please review the following plan we discussed and let me know if I can assist you in the future. Your Game plan/ To Do List    Referrals: If you haven't heard from the office you've been referred to, please reach out to them at the phone provided.   Follow up Visits: We will see or speak with you next year for your Next Medicare AWV with our clinical staff Have you seen your provider in the last 6 months (3 months if uncontrolled diabetes)? Yes  Clinician Recommendations:  Aim for 30 minutes of exercise or brisk walking, 6-8 glasses of water, and 5 servings of fruits and vegetables each day.       This is a list of the screenings recommended for you:  Health Maintenance  Topic Date Due   Yearly kidney health urinalysis for diabetes  Never done   Zoster (Shingles) Vaccine (1 of 2) Never done   COVID-19 Vaccine (4 - 2024-25 season) 09/14/2022   Complete foot exam   05/27/2023   Flu Shot  08/14/2023   Colon Cancer Screening  08/19/2023*   Hemoglobin A1C  10/28/2023   Yearly kidney function blood test for diabetes  11/06/2023   Eye exam for diabetics  06/15/2024   Medicare Annual Wellness Visit  08/16/2024   DTaP/Tdap/Td vaccine (3 - Td or Tdap) 06/01/2033   Pneumococcal Vaccine for age over 84  Completed   Hepatitis C Screening  Completed   Hepatitis B Vaccine  Aged Out   HPV Vaccine  Aged Out   Meningitis B Vaccine  Aged Out  *Topic was postponed. The date shown is not the original due date.    Advanced directives: (ACP Link)Information on Advanced Care Planning can be found at [place]  Secretary of [facility] Advance Health Care Directives Advance Health Care Directives. [email or web address]  Advance Care Planning  is important because it:  [x]  Makes sure you receive the medical care that is consistent with your values, goals, and preferences  [x]  It provides guidance to your family and loved ones and reduces their decisional burden about whether or not they are making the right decisions based on your wishes.  Follow the link provided in your after visit summary or read over the paperwork we have mailed to you to help you started getting your Advance Directives in place. If you need assistance in completing these, please reach out to us  so that we can help you!  See attachments for Preventive Care and Fall Prevention Tips.

## 2023-08-24 ENCOUNTER — Ambulatory Visit: Payer: Self-pay | Admitting: Family Medicine

## 2023-08-24 ENCOUNTER — Encounter: Payer: Self-pay | Admitting: Family Medicine

## 2023-08-24 ENCOUNTER — Ambulatory Visit (INDEPENDENT_AMBULATORY_CARE_PROVIDER_SITE_OTHER): Admitting: Family Medicine

## 2023-08-24 VITALS — BP 126/66 | HR 59 | Temp 97.4°F | Ht 68.0 in | Wt 248.4 lb

## 2023-08-24 DIAGNOSIS — E782 Mixed hyperlipidemia: Secondary | ICD-10-CM | POA: Diagnosis not present

## 2023-08-24 DIAGNOSIS — I1A0 Resistant hypertension: Secondary | ICD-10-CM | POA: Diagnosis not present

## 2023-08-24 DIAGNOSIS — R809 Proteinuria, unspecified: Secondary | ICD-10-CM

## 2023-08-24 DIAGNOSIS — E1129 Type 2 diabetes mellitus with other diabetic kidney complication: Secondary | ICD-10-CM

## 2023-08-24 DIAGNOSIS — D170 Benign lipomatous neoplasm of skin and subcutaneous tissue of head, face and neck: Secondary | ICD-10-CM | POA: Insufficient documentation

## 2023-08-24 DIAGNOSIS — Z794 Long term (current) use of insulin: Secondary | ICD-10-CM

## 2023-08-24 DIAGNOSIS — Z1211 Encounter for screening for malignant neoplasm of colon: Secondary | ICD-10-CM

## 2023-08-24 LAB — URINALYSIS, ROUTINE W REFLEX MICROSCOPIC
Bilirubin Urine: NEGATIVE
Hgb urine dipstick: NEGATIVE
Ketones, ur: NEGATIVE
Leukocytes,Ua: NEGATIVE
Nitrite: NEGATIVE
RBC / HPF: NONE SEEN (ref 0–?)
Specific Gravity, Urine: 1.02 (ref 1.000–1.030)
Total Protein, Urine: NEGATIVE
Urine Glucose: NEGATIVE
Urobilinogen, UA: 0.2 (ref 0.0–1.0)
pH: 6 (ref 5.0–8.0)

## 2023-08-24 LAB — HEMOGLOBIN A1C: Hgb A1c MFr Bld: 7 % — ABNORMAL HIGH (ref 4.6–6.5)

## 2023-08-24 LAB — BASIC METABOLIC PANEL WITH GFR
BUN: 24 mg/dL — ABNORMAL HIGH (ref 6–23)
CO2: 28 meq/L (ref 19–32)
Calcium: 9.6 mg/dL (ref 8.4–10.5)
Chloride: 100 meq/L (ref 96–112)
Creatinine, Ser: 1.27 mg/dL (ref 0.40–1.50)
GFR: 55.52 mL/min — ABNORMAL LOW (ref >60.00)
Glucose, Bld: 129 mg/dL — ABNORMAL HIGH (ref 70–99)
Potassium: 3.3 meq/L — ABNORMAL LOW (ref 3.5–5.1)
Sodium: 138 meq/L (ref 135–145)

## 2023-08-24 LAB — MICROALBUMIN / CREATININE URINE RATIO
Creatinine,U: 95.8 mg/dL
Microalb Creat Ratio: 47.8 mg/g — ABNORMAL HIGH (ref 0.0–30.0)
Microalb, Ur: 4.6 mg/dL — ABNORMAL HIGH (ref 0.0–1.9)

## 2023-08-24 LAB — LIPID PANEL
Cholesterol: 186 mg/dL (ref 0–200)
HDL: 30 mg/dL — ABNORMAL LOW (ref >39.00)
LDL Cholesterol: 132 mg/dL — ABNORMAL HIGH (ref 0–99)
NonHDL: 155.92
Total CHOL/HDL Ratio: 6
Triglycerides: 122 mg/dL (ref 0.0–149.0)
VLDL: 24.4 mg/dL (ref 0.0–40.0)

## 2023-08-24 NOTE — Assessment & Plan Note (Signed)
 Blood sugars are reportedly improving at home. Continue insulin  glargine (Lantus ) 22 units at bedtime and insulin  lispro 40 units with each meal. Weight is down 11 lbs. I strongly urge him to continue with his dietary changes and maintain his physical activity.

## 2023-08-24 NOTE — Assessment & Plan Note (Signed)
 Mr. Gladu has hyperlipidemia, but has been intolerant of statins. He prefers to continue to work at weight loss.

## 2023-08-24 NOTE — Assessment & Plan Note (Signed)
 Chase [name] BP is in good control. Continue amlodipine  10 mg daily, doxazosin  8 mg daily, chlorthalidone  25 mg daily and valsartan  320 mg daily.

## 2023-08-24 NOTE — Progress Notes (Signed)
 [facility] [address] [place] [address] Dept: [phone] Dept Fax: [phone]  Chronic Care Office Visit  Subjective:    Patient ID: Chase [name], male    DOB: 1950/03/03, 73 y.o..   MRN: [id number]  Chief Complaint  Patient presents with   Hypertension    4 month fu HTN.  No concerns.   Fasting today.    History of Present Illness:  Patient is in today for reassessment of chronic medical issues.  Mr. Mellinger has a history of Type 2 diabetes. He is managed on insulin  glargine (Lantus ) 22 units at bedtime and insulin  lispro 40 units with each meal.  I had tried starting him on tirzepatide  (Mounjaro ), but he found the $400/mo. co-pay to be too expensive for him to afford. he ahs started tryign to eat a lower calorie, high protein diet so as to lose weight. he is pleased that his weight is coming down.   Mr. Hoge has a history of hyperlipidemia. He had significant muscle cramps/pain associated with using a statin. He has been reluctant to be more aggressive about managing his lipids, preferring to work at lifestyle issues.   Mr. Agrusa has a history of hypertension. He is managed on amlodipine  10 mg daily, doxazosin  8 mg daily, chlorthalidone  25 mg daily, and valsartan  320 mg daily. [facility].  Mr. Wintle notes he has a lump on the back of his neck. This has been present for many years. He does nto feel this is changing. He has wondered if he needed to have something done about this.  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   Malignant neoplasm of prostate (HCC) 02/11/2019    Sleep apnea with use of continuous positive airway pressure (CPAP) 04/04/2016   Hypersomnia, persistent 06/03/2012   Class 2 obesity due to excess calories with body mass index (BMI) of 38.0 to 38.9 in adult 06/03/2012   History of colon polyps 06/05/2007   History of urinary stone 06/05/2007   Resistant hypertension 06/05/2007   Hyperlipidemia 06/05/2007   Type 2 diabetes mellitus with renal complication (HCC) 06/05/2007   Past Surgical History:  Procedure Laterality Date   APPENDECTOMY  1972   PROSTATE BIOPSY     Family History  Problem Relation Age of Onset   Dementia Mother    Heart disease Father    Neuropathy Brother    Diabetes Maternal Aunt    Heart disease Paternal Uncle    Stomach cancer Neg Hx    Prostate cancer Neg Hx    Breast cancer Neg Hx    Pancreatic cancer Neg Hx    Outpatient Medications Prior to Visit  Medication Sig Dispense Refill   amLODipine  (NORVASC ) 10 MG tablet Take 1 tablet (10 mg total) by mouth daily. 90 tablet 3   aspirin 81 MG tablet Take 81 mg by mouth daily.     chlorthalidone  (HYGROTON ) 25 MG tablet Take 1 tablet by mouth once daily 90 tablet 0   doxazosin  (CARDURA ) 8 MG tablet Take 1 tablet (8 mg total) by mouth daily. 90 tablet 3   fluticasone (FLONASE) 50 MCG/ACT nasal spray Place 2  sprays into both nostrils daily.     insulin  glargine (LANTUS  SOLOSTAR) 100 UNIT/ML Solostar Pen Inject 22 Units into the skin at bedtime. 15 mL 4   insulin  regular (NOVOLIN R) 100 units/mL injection Inject into the skin 3 (three) times daily before meals. (Patient taking differently: Inject 30 Units into the skin 3 (three) times daily before meals.)     Insulin  Syringe-Needle U-100 (INSULIN  SYRINGE .3CC/29GX1/2) 29G X 1/2 0.3 ML MISC Use as directed three times a day 100 each 11   valsartan  (DIOVAN ) 320 MG tablet Take 1 tablet (320 mg total) by mouth daily. 90 tablet 3   Semaglutide ,0.25 or 0.5MG /DOS, (OZEMPIC , 0.25 OR 0.5 MG/DOSE,) 2 MG/3ML SOPN Inject 0.25 mg  into the skin once a week for 28 days, THEN 0.5 mg once a week (Patient not taking: Reported on 08/24/2023) 3 mL 5   insulin  lispro (HUMALOG ) 100 UNIT/ML injection Inject 0.3 mLs (30 Units total) into the skin 3 (three) times daily before meals. (Patient not taking: Reported on 08/24/2023) 10 mL 11   No facility-administered medications prior to visit.   Allergies  Allergen Reactions   Codeine Nausea And Vomiting   Metformin  And Related Diarrhea   Objective:   Today's Vitals   08/24/23 1035  BP: 126/66  Pulse: (!) 59  Temp: (!) 97.4 F (36.3 C)  TempSrc: Temporal  SpO2: 98%  Weight: 248 lb 6.4 oz (112.7 kg)  Height: 5' 8 (1.727 m)   Body mass index is 37.77 kg/m.   General: Well developed, well nourished. No acute distress. Neck: Supple. There is a 6-8 cm subcutaneous mass overlying the right posterior neck. This feels soft and is freely mobile   under the skin.  No lymphadenopathy.  Feet- Skin intact. No sign of maceration between toes. Left great toe nail has some partial yellow infiltration with   thickening. Other nails are normal. Dorsalis pedis and posterior tibial artery pulses are normal. 5.07 monofilament testing   normal. Psych: Alert and oriented. Normal mood and affect.  Health Maintenance Due  Topic Date Due   Diabetic kidney evaluation - Urine ACR  Never done   Zoster Vaccines- Shingrix (1 of 2) Never done   Colonoscopy  07/06/2016   FOOT EXAM  05/27/2023   INFLUENZA VACCINE  08/14/2023   Assessment & Plan:   Problem List Items Addressed This Visit       Cardiovascular and Mediastinum   Resistant hypertension - Primary   Mr. Bogle BP is in good control. Continue amlodipine  10 mg daily, doxazosin  8 mg daily, chlorthalidone  25 mg daily and valsartan  320 mg daily.        Endocrine   Type 2 diabetes mellitus with renal complication (HCC)   Blood sugars are reportedly improving at home. Continue insulin  glargine (Lantus ) 22 units at bedtime and  insulin  lispro 40 units with each meal. Weight is down 11 lbs. I strongly urge him to continue with his dietary changes and maintain his physical activity.      Relevant Orders   Lipid panel   Microalbumin / creatinine urine ratio   Basic metabolic panel with GFR   Hemoglobin A1c   Urinalysis, Routine w reflex microscopic     Musculoskeletal and Integument   Lipoma of neck   Reassured Mr. Dorr that this appears to be a lipoma. This is a benign mass. He could consider surgical excision if this is restricting neck movement or painful.        Other  Hyperlipidemia   Mr. Zilka has hyperlipidemia, but has been intolerant of statins. He prefers to continue to work at weight loss.       Relevant Orders   Lipid panel   Other Visit Diagnoses       Screening for colon cancer       Relevant Orders   Ambulatory referral to Gastroenterology       Return in about 4 months (around 12/24/2023) for Reassessment.   Garnette CHRISTELLA Simpler, MD

## 2023-08-24 NOTE — Assessment & Plan Note (Signed)
 Reassured Chase [name] that this appears to be a lipoma. This is a benign mass. He could consider surgical excision if this is restricting neck movement or painful.

## 2023-08-26 ENCOUNTER — Other Ambulatory Visit: Payer: Self-pay | Admitting: Family Medicine

## 2023-08-26 DIAGNOSIS — I1A0 Resistant hypertension: Secondary | ICD-10-CM

## 2023-09-17 ENCOUNTER — Other Ambulatory Visit: Payer: Self-pay | Admitting: Family Medicine

## 2023-09-17 DIAGNOSIS — I1A0 Resistant hypertension: Secondary | ICD-10-CM

## 2023-10-14 NOTE — Progress Notes (Signed)
 Aram Domzalski                                          MRN: [id number]   10/14/2023   The [facility] Quality Team Specialist reviewed this patient medical record for the purposes of chart review for care gap closure. The following were reviewed: abstraction for care gap closure-kidney health evaluation for diabetes:eGFR  and uACR.    [facility]BCI Quality Team

## 2023-11-16 ENCOUNTER — Other Ambulatory Visit: Payer: Self-pay | Admitting: Family Medicine

## 2023-11-16 DIAGNOSIS — I1A0 Resistant hypertension: Secondary | ICD-10-CM

## 2023-11-23 ENCOUNTER — Encounter: Payer: Self-pay | Admitting: Family Medicine

## 2023-11-30 ENCOUNTER — Other Ambulatory Visit (HOSPITAL_BASED_OUTPATIENT_CLINIC_OR_DEPARTMENT_OTHER): Payer: Self-pay | Admitting: Family

## 2023-11-30 DIAGNOSIS — I10 Essential (primary) hypertension: Secondary | ICD-10-CM

## 2023-12-02 ENCOUNTER — Other Ambulatory Visit (HOSPITAL_BASED_OUTPATIENT_CLINIC_OR_DEPARTMENT_OTHER): Payer: Self-pay | Admitting: Family

## 2023-12-02 DIAGNOSIS — I10 Essential (primary) hypertension: Secondary | ICD-10-CM

## 2023-12-15 DIAGNOSIS — Z8546 Personal history of malignant neoplasm of prostate: Secondary | ICD-10-CM | POA: Diagnosis not present

## 2023-12-17 NOTE — Progress Notes (Signed)
 Chase [name]                                          MRN: [id number]   12/17/2023   The [facility]I Quality Team Specialist reviewed this patient medical record for the purposes of chart review for care gap closure. The following were reviewed: abstraction for care gap closure-glycemic status assessment.    [facility]BCI Quality Team

## 2023-12-22 DIAGNOSIS — N528 Other male erectile dysfunction: Secondary | ICD-10-CM | POA: Diagnosis not present

## 2023-12-22 DIAGNOSIS — R3912 Poor urinary stream: Secondary | ICD-10-CM | POA: Diagnosis not present

## 2023-12-22 DIAGNOSIS — Z8546 Personal history of malignant neoplasm of prostate: Secondary | ICD-10-CM | POA: Diagnosis not present

## 2023-12-23 NOTE — Assessment & Plan Note (Addendum)
 Last A1c at 7%. Continue insulin  glargine (Lantus ) 22 units at bedtime and insulin  lispro 40 units with each meal. Mr. Line would like to move ahead with semaglutide  (Ozempic ). He notes he is willing to pay out-of-pocket if necessary. I will prescribe this at 0.25 mg weekly for 28 days, then increasing to 0.5 mg weekly.

## 2023-12-23 NOTE — Assessment & Plan Note (Signed)
 Chase [name] has hyperlipidemia, but has been intolerant of statins. He prefers to continue to work at weight loss.

## 2023-12-23 NOTE — Assessment & Plan Note (Addendum)
 Mr. Kovar BP is in adequate control. Continue amlodipine  10 mg daily, doxazosin  8 mg daily, chlorthalidone  25 mg daily and valsartan  320 mg daily.

## 2023-12-23 NOTE — Progress Notes (Signed)
 [facility] [address] [place] [address] Dept: [phone] Dept Fax: [phone]  Chronic Care Office Visit  Subjective:    Patient ID: Chase [name], male    DOB: 07-30-1950, 73 y.o..   MRN: [id number]  Chief Complaint  Patient presents with   Hypertension    4 month f/u HTN.  No concerns.  Declines flu shot and Colonoscopy   History of Present Illness:  Patient is in today for reassessment of chronic medical conditions.   Mr. Chase [name] has a history of Type 2 diabetes. He is managed on insulin  glargine (Lantus ) 22 units at bedtime and insulin  lispro 40 units with each meal.  I had tried starting him on tirzepatide  (Mounjaro ), but he found the $400/mo. co-pay to be too expensive for him to afford. He has worked at dietary changes to lose weight. 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 He admits he isn't getting walking in on his off days. His diabetes is complicated by stage 3 a CKD.   Mr. Chase [name] has a history of hyperlipidemia. He had significant muscle cramps/pain associated with using a statin. He has been reluctant to be more aggressive about managing his lipids, preferring to work at lifestyle issues.   Mr. Chase [name] has a history of hypertension. He is managed on amlodipine  10 mg daily, doxazosin  8 mg daily, chlorthalidone  25 mg daily, and valsartan  320 mg daily. [facility].  Mr. Chase [name] is past due for colon cancer screening. He notes that at age 42, he is no interested in pursuing this further. He denies any blood in his stool.  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   Malignant neoplasm of prostate (HCC) 02/11/2019   Sleep apnea with use of continuous positive airway pressure (CPAP) 04/04/2016   Hypersomnia, persistent 06/03/2012   Class 2 obesity due to excess calories with body mass index (BMI) of 38.0 to 38.9 in adult 06/03/2012   History of colon polyps 06/05/2007   History of urinary stone 06/05/2007   Resistant hypertension 06/05/2007   Hyperlipidemia 06/05/2007   Type 2 diabetes mellitus with renal complication (HCC) 06/05/2007   Past Surgical History:  Procedure Laterality Date   APPENDECTOMY  1972   PROSTATE BIOPSY     Family History  Problem Relation Age of Onset   Dementia Mother    Heart disease Father    Neuropathy Brother    Diabetes Maternal Aunt    Heart disease Paternal Uncle    Stomach cancer Neg Hx    Prostate cancer Neg Hx    Breast cancer Neg Hx    Pancreatic cancer Neg Hx    Outpatient Medications Prior to Visit  Medication Sig Dispense Refill   amLODipine  (NORVASC ) 10 MG tablet Take 1 tablet by mouth once daily 90 tablet 3   aspirin 81 MG tablet Take 81 mg by mouth daily.     chlorthalidone  (HYGROTON ) 25 MG tablet Take 1 tablet by mouth once daily 90 tablet 3   doxazosin  (CARDURA ) 8 MG tablet Take 1 tablet by mouth once daily 30 tablet 0  fluticasone (FLONASE) 50 MCG/ACT nasal spray Place 2 sprays into both nostrils daily.     insulin  glargine (LANTUS  SOLOSTAR) 100 UNIT/ML Solostar Pen Inject 22 Units into the skin at bedtime. 15 mL 4   insulin  regular (NOVOLIN R) 100 units/mL injection Inject into the skin 3 (three) times daily before meals. (Patient taking differently: Inject 30 Units into the skin 3 (three) times daily before meals.)     Insulin  Syringe-Needle U-100 (INSULIN  SYRINGE .3CC/29GX1/2) 29G X 1/2 0.3 ML MISC Use as directed three times a day 100 each 11   Semaglutide ,0.25 or 0.5MG /DOS, (OZEMPIC , 0.25 OR 0.5 MG/DOSE,) 2 MG/3ML SOPN Inject 0.25 mg into  the skin once a week for 28 days, THEN 0.5 mg once a week (Patient not taking: Reported on 08/24/2023) 3 mL 5   valsartan  (DIOVAN ) 320 MG tablet Take 1 tablet by mouth once daily 90 tablet 3   No facility-administered medications prior to visit.   Allergies  Allergen Reactions   Codeine Nausea And Vomiting   Metformin  And Related Diarrhea     Objective:   Today's Vitals   12/24/23 0958  BP: 136/70  Pulse: 75  Temp: 97.9 F (36.6 C)  TempSrc: Temporal  SpO2: 98%  Weight: 257 lb (116.6 kg)  Height: 5' 8 (1.727 m)   Body mass index is 39.08 kg/m.   General: Well developed, well nourished. No acute distress. Psych: Alert and oriented. Normal mood and affect.  Health Maintenance Due  Topic Date Due   Zoster Vaccines- Shingrix (1 of 2) Never done   Colonoscopy  07/06/2016   Influenza Vaccine  08/14/2023   COVID-19 Vaccine (4 - 2025-26 season) 09/14/2023   Lab Results Lipid Panel:  Lab Results  Component Value Date   CHOL 186 08/24/2023   HDL 30.00 (L) 08/24/2023   LDLCALC 132 (H) 08/24/2023   TRIG 122.0 08/24/2023   Lab Results  Component Value Date   HGBA1C 7.0 (H) 08/24/2023     Assessment & Plan:   Problem List Items Addressed This Visit       Cardiovascular and Mediastinum   Resistant hypertension - Primary   Mr. Chase [name] BP is in adequate control. Continue amlodipine  10 mg daily, doxazosin  8 mg daily, chlorthalidone  25 mg daily and valsartan  320 mg daily.        Endocrine   Type 2 diabetes mellitus with stage 3a chronic kidney disease, with long-term current use of insulin  (HCC)   Last A1c at 7%. Continue insulin  glargine (Lantus ) 22 units at bedtime and insulin  lispro 40 units with each meal. [name] would like to move ahead with semaglutide  (Ozempic ). He notes he is willing to pay out-of-pocket if necessary. I will prescribe this at 0.25 mg weekly for 28 days, then increasing to 0.5 mg weekly.      Relevant Medications   insulin  NPH Human (NOVOLIN  N) 100 UNIT/ML injection   Semaglutide ,0.25 or 0.5MG /DOS, (OZEMPIC , 0.25 OR 0.5 MG/DOSE,) 2 MG/3ML SOPN     Genitourinary   Chronic kidney disease, stage 3a (HCC)   Continue focus on blood pressure and glucose control, adequate hydration, and avoidance of nephrotoxic medications.         Other   Hyperlipidemia   Mr. Hillery has hyperlipidemia, but has been intolerant of statins. He prefers to continue to work at weight loss.       Other Visit Diagnoses       Encounter for long-term (current) insulin  use (HCC)  Return in about 3 months (around 03/23/2024) for Reassessment.   Garnette CHRISTELLA Simpler, MD  I,Emily Lagle,acting as a scribe for Garnette CHRISTELLA Simpler, MD.,have documented all relevant documentation on the behalf of Garnette CHRISTELLA Simpler, MD.  I, Garnette CHRISTELLA Simpler, MD, have reviewed all documentation for this visit. The documentation on 12/24/2023 for the exam, diagnosis, procedures, and orders are all accurate and complete.

## 2023-12-24 ENCOUNTER — Ambulatory Visit: Admitting: Family Medicine

## 2023-12-24 ENCOUNTER — Encounter: Payer: Self-pay | Admitting: Family Medicine

## 2023-12-24 VITALS — BP 136/70 | HR 75 | Temp 97.9°F | Ht 68.0 in | Wt 257.0 lb

## 2023-12-24 DIAGNOSIS — E1122 Type 2 diabetes mellitus with diabetic chronic kidney disease: Secondary | ICD-10-CM | POA: Diagnosis not present

## 2023-12-24 DIAGNOSIS — Z794 Long term (current) use of insulin: Secondary | ICD-10-CM | POA: Diagnosis not present

## 2023-12-24 DIAGNOSIS — E782 Mixed hyperlipidemia: Secondary | ICD-10-CM | POA: Diagnosis not present

## 2023-12-24 DIAGNOSIS — I1A0 Resistant hypertension: Secondary | ICD-10-CM | POA: Diagnosis not present

## 2023-12-24 DIAGNOSIS — N1831 Chronic kidney disease, stage 3a: Secondary | ICD-10-CM | POA: Diagnosis not present

## 2023-12-24 DIAGNOSIS — R809 Proteinuria, unspecified: Secondary | ICD-10-CM

## 2023-12-24 LAB — HEMOGLOBIN A1C: Hgb A1c MFr Bld: 7.1 % — ABNORMAL HIGH (ref 4.6–6.5)

## 2023-12-24 LAB — GLUCOSE, RANDOM: Glucose, Bld: 161 mg/dL — ABNORMAL HIGH (ref 70–99)

## 2023-12-24 MED ORDER — OZEMPIC (0.25 OR 0.5 MG/DOSE) 2 MG/3ML ~~LOC~~ SOPN: PEN_INJECTOR | SUBCUTANEOUS | 5 refills | Status: AC

## 2023-12-25 ENCOUNTER — Ambulatory Visit: Payer: Self-pay | Admitting: Family Medicine

## 2023-12-25 DIAGNOSIS — N1831 Chronic kidney disease, stage 3a: Secondary | ICD-10-CM | POA: Insufficient documentation

## 2023-12-25 NOTE — Assessment & Plan Note (Signed)
 Continue focus on blood pressure and glucose control, adequate hydration, and avoidance of nephrotoxic medications.

## 2024-01-01 ENCOUNTER — Other Ambulatory Visit (HOSPITAL_BASED_OUTPATIENT_CLINIC_OR_DEPARTMENT_OTHER): Payer: Self-pay | Admitting: Cardiovascular Disease

## 2024-01-01 DIAGNOSIS — I10 Essential (primary) hypertension: Secondary | ICD-10-CM

## 2024-01-21 ENCOUNTER — Other Ambulatory Visit (HOSPITAL_BASED_OUTPATIENT_CLINIC_OR_DEPARTMENT_OTHER): Payer: Self-pay | Admitting: Cardiovascular Disease

## 2024-01-21 DIAGNOSIS — I10 Essential (primary) hypertension: Secondary | ICD-10-CM

## 2024-01-21 NOTE — Telephone Encounter (Signed)
 dCopied from CRM #[id number]. Topic: General - Other >> Jan 21, 2024 11:55 AM Alfonso ORN wrote: Reason for CRM: pt would like callback from provider medical assistance as pharmacy advised only 15 more days no refill for medication and would like to confirm if this was done in error or if it will be removed from his med list . Please call pt to advise  [phone]

## 2024-01-21 NOTE — Telephone Encounter (Signed)
 Called patient, he is completely out of th Doxazosin  8 mg.  Called [facility] to see if they can give him a couple tablets till provider is back in the office tomorrow.   They will give him # 5 tabs. Patient notified VIA phone. SABRA Dm/cma

## 2024-01-22 MED ORDER — DOXAZOSIN MESYLATE 8 MG PO TABS: 8.0000 mg | ORAL_TABLET | Freq: Every day | ORAL | 2 refills | Status: AC

## 2024-03-23 ENCOUNTER — Ambulatory Visit: Admitting: Family Medicine
